# Patient Record
Sex: Male | Born: 1935 | Race: Black or African American | Hispanic: No | Marital: Married | Smoking: Former smoker
Health system: Southern US, Community
[De-identification: ages and names within clinical notes are randomized; demographics above are authoritative.]

## PROBLEM LIST (undated history)

## (undated) DIAGNOSIS — N4289 Other specified disorders of prostate: Secondary | ICD-10-CM

## (undated) HISTORY — PX: HIP SURGERY: SHX245

---

## 2017-11-14 DIAGNOSIS — H903 Sensorineural hearing loss, bilateral: Secondary | ICD-10-CM | POA: Insufficient documentation

## 2018-08-06 ENCOUNTER — Emergency Department (HOSPITAL_COMMUNITY): Payer: Medicare (Managed Care)

## 2018-08-06 ENCOUNTER — Emergency Department (HOSPITAL_COMMUNITY)
Admission: EM | Admit: 2018-08-06 | Discharge: 2018-08-06 | Disposition: A | Discharge status: Home / Self Care | Payer: Medicare (Managed Care) | Attending: Emergency Medicine | Admitting: Emergency Medicine

## 2018-08-06 ENCOUNTER — Other Ambulatory Visit: Payer: Self-pay

## 2018-08-06 ENCOUNTER — Encounter (HOSPITAL_COMMUNITY): Payer: Self-pay | Admitting: Emergency Medicine

## 2018-08-06 DIAGNOSIS — Y92009 Unspecified place in unspecified non-institutional (private) residence as the place of occurrence of the external cause: Secondary | ICD-10-CM | POA: Insufficient documentation

## 2018-08-06 DIAGNOSIS — W010XXA Fall on same level from slipping, tripping and stumbling without subsequent striking against object, initial encounter: Secondary | ICD-10-CM | POA: Diagnosis not present

## 2018-08-06 DIAGNOSIS — Y939 Activity, unspecified: Secondary | ICD-10-CM | POA: Diagnosis not present

## 2018-08-06 DIAGNOSIS — S32000A Wedge compression fracture of unspecified lumbar vertebra, initial encounter for closed fracture: Secondary | ICD-10-CM | POA: Diagnosis not present

## 2018-08-06 DIAGNOSIS — W19XXXA Unspecified fall, initial encounter: Secondary | ICD-10-CM

## 2018-08-06 DIAGNOSIS — S32592A Other specified fracture of left pubis, initial encounter for closed fracture: Secondary | ICD-10-CM | POA: Diagnosis not present

## 2018-08-06 DIAGNOSIS — Z79899 Other long term (current) drug therapy: Secondary | ICD-10-CM | POA: Insufficient documentation

## 2018-08-06 DIAGNOSIS — S3992XA Unspecified injury of lower back, initial encounter: Secondary | ICD-10-CM | POA: Diagnosis present

## 2018-08-06 DIAGNOSIS — Z87891 Personal history of nicotine dependence: Secondary | ICD-10-CM | POA: Diagnosis not present

## 2018-08-06 DIAGNOSIS — Y999 Unspecified external cause status: Secondary | ICD-10-CM | POA: Diagnosis not present

## 2018-08-06 HISTORY — DX: Other specified disorders of prostate: N42.89

## 2018-08-06 MED ORDER — OXYCODONE HCL 5 MG PO TABS: 5.0000 mg | ORAL_TABLET | ORAL | 0 refills | Status: AC | PRN

## 2018-08-06 MED ORDER — HYDROCODONE-ACETAMINOPHEN 5-325 MG PO TABS
1.0000 | ORAL_TABLET | Freq: Once | ORAL | Status: AC
  Administered 2018-08-06: 1 via ORAL
  Filled 2018-08-06: qty 1

## 2018-08-06 MED ORDER — OXYCODONE HCL 5 MG PO TABS
5.0000 mg | ORAL_TABLET | Freq: Once | ORAL | Status: AC
  Administered 2018-08-06: 5 mg via ORAL
  Filled 2018-08-06: qty 1

## 2018-08-06 NOTE — ED Provider Notes (Signed)
[facility] DEPT Provider Note   CSN: [id number] Arrival date & time: 08/06/18  0630     History   Chief Complaint Chief Complaint  Patient presents with  . Fall    HPI Andres Cruz is a 84 y.o. male.  HPI    84 year old male presents with concern for fall yesterday with difficulty walking today, back and leg pain.  Patient reports a mechanical fall yesterday, where he fell backwards onto his buttock, but reports he initially had felt okay.  He reports he went to pace yesterday because he had cold-like symptoms, was able to walk in and out without any symptoms.  He reports he is walking with his walker with severe pain.  Reports difficulty moving bilateral legs, however worse on the left.  Denies new numbness or weakness.  Reports that left leg movement is limited by pain.  No other concern for symptoms, including chest pain, shortness of breath, fevers, cough, denies syncope.  Reports that he did not hit his head yesterday, has no headache, no nausea or vomiting, and is not on anticoagulation. Past Medical History:  Diagnosis Date  . Other specified disorders of prostate     There are no active problems to display for this patient.   Past Surgical History:  Procedure Laterality Date  . HIP SURGERY          Home Medications    Prior to Admission medications   Medication Sig Start Date End Date Taking? Authorizing Provider  atorvastatin (LIPITOR) 10 MG tablet Take 10 mg by mouth daily.   Yes [provider]  busPIRone (BUSPAR) 10 MG tablet Take 20 mg by mouth at bedtime.    Yes [provider]  busPIRone (BUSPAR) 5 MG tablet Take 10 mg by mouth daily.   Yes [provider]  famotidine (PEPCID) 10 MG tablet Take 10 mg by mouth 2 (two) times daily.   Yes [provider]  gabapentin (NEURONTIN) 600 MG tablet Take 600 mg by mouth 2 (two) times daily.  08/12/17  Yes [provider]    hydrochlorothiazide (HYDRODIURIL) 25 MG tablet Take 25 mg by mouth daily.   Yes [provider]  HYDROcodone-acetaminophen (NORCO/VICODIN) 5-325 MG tablet Take 1 tablet by mouth every 6 (six) hours as needed for moderate pain.  08/30/17  Yes [provider]  Hypromellose (ARTIFICIAL TEARS OP) Apply 1 drop to eye daily as needed.   Yes [provider]  meloxicam (MOBIC) 15 MG tablet Take 15 mg by mouth daily as needed for pain.   Yes [provider]  Nutritional Supplements (ENSURE PO) Take 1 Can by mouth daily.   Yes [provider]  omeprazole (PRILOSEC) 20 MG capsule Take 20 mg by mouth daily.   Yes [provider]  Pancrelipase, Lip-Prot-Amyl, 24000-76000 units CPEP Take 24,000 Units by mouth 3 (three) times daily.    Yes [provider]  senna-docusate (SENOKOT-S) 8.6-50 MG tablet Take 1 tablet by mouth daily.   Yes [provider]  tamsulosin (FLOMAX) 0.4 MG CAPS capsule Take 0.4 mg by mouth daily.   Yes [provider]  triamcinolone cream (KENALOG) 0.1 % Apply 1 application topically 2 (two) times daily as needed for rash.   Yes [provider]  VITAMIN D, CHOLECALCIFEROL, PO Take 2,000 Units by mouth daily.   Yes [provider]  oxyCODONE (ROXICODONE) 5 MG immediate release tablet Take 1 tablet (5 mg total) by mouth every 4 (four) hours  as needed for severe pain. 08/06/18   Gareth Morgan, MD    Family History History reviewed. No pertinent family history.  Social History Social History   Tobacco Use  . Smoking status: Former Smoker    Types: Cigarettes    Last attempt to quit: 08/06/1998    Years since quitting: 20.0  . Smokeless tobacco: Never Used  Substance Use Topics  . Alcohol use: Not Currently  . Drug use: Never     Allergies   Patient has no known allergies.   Review of Systems Review of Systems  Constitutional: Negative for fever.  HENT: Positive for  congestion and postnasal drip. Negative for sore throat.   Eyes: Negative for visual disturbance.  Respiratory: Negative for shortness of breath.   Cardiovascular: Negative for chest pain.  Gastrointestinal: Negative for abdominal pain, nausea and vomiting.  Genitourinary: Negative for difficulty urinating.  Musculoskeletal: Positive for arthralgias and back pain. Negative for neck stiffness.  Skin: Negative for rash.  Neurological: Negative for syncope and headaches.     Physical Exam Updated Vital Signs BP 127/60   Pulse 79   Temp 98.4 F (36.9 C) (Oral)   Resp 20   Ht 6' (1.829 m)   Wt 81.2 kg   SpO2 97%   BMI 24.28 kg/m   Physical Exam Vitals signs and nursing note reviewed.  Constitutional:      General: He is not in acute distress.    Appearance: He is well-developed. He is not diaphoretic.  HENT:     Head: Normocephalic and atraumatic.  Eyes:     Conjunctiva/sclera: Conjunctivae normal.  Neck:     Musculoskeletal: Normal range of motion.  Cardiovascular:     Rate and Rhythm: Normal rate and regular rhythm.     Heart sounds: Normal heart sounds. No murmur. No friction rub. No gallop.   Pulmonary:     Effort: Pulmonary effort is normal. No respiratory distress.     Breath sounds: Normal breath sounds. No wheezing or rales.  Abdominal:     General: There is no distension.     Palpations: Abdomen is soft.     Tenderness: There is no abdominal tenderness. There is no guarding.  Musculoskeletal:     Right hip: He exhibits tenderness and bony tenderness.     Left hip: He exhibits tenderness and bony tenderness.     Cervical back: He exhibits no tenderness and no bony tenderness.     Thoracic back: He exhibits tenderness and bony tenderness (low thoracic).     Lumbar back: He exhibits tenderness and bony tenderness.  Skin:    General: Skin is warm and dry.  Neurological:     Mental Status: He is alert and oriented to person, place, and time.      ED  Treatments / Results  Labs (all labs ordered are listed, but only abnormal results are displayed) Labs Reviewed - No data to display  EKG None  Radiology Dg Thoracic Spine 2 View  Result Date: 08/06/2018 CLINICAL DATA:  The patient suffered a trip and fall yesterday. Thoracic spine pain. Initial encounter. EXAM: THORACIC SPINE 2 VIEWS COMPARISON:  None. FINDINGS: The patient has compression fractures of the T7 to L1 vertebral bodies. The patient is status post T11 vertebral augmentation. The fractures appear remote but are age indeterminate. There is exaggeration of the normal thoracic kyphosis. IMPRESSION: T7-L1 compression fractures appear remote but can not be definitively characterized. The patient is status post T11 vertebral augmentation. Electronically  Signed   By: Inge Rise M.D.   On: 08/06/2018 11:51   Dg Lumbar Spine Complete  Result Date: 08/06/2018 CLINICAL DATA:  Tripped and fell yesterday.  Back pain. EXAM: LUMBAR SPINE - COMPLETE 4+ VIEW COMPARISON:  None. FINDINGS: Degenerative anterolisthesis of L4 due to advanced facet disease. No definite pars defects. The other vertebral bodies are normally aligned. There are lower thoracic compression deformities with vertebral augmentation changes noted in T11. Compression fractures of T9, T10, V49 and L1 of uncertain age. The visualized bony pelvis is intact. A right hip prosthesis is noted. Advanced atherosclerotic calcifications involving the aorta and iliac arteries but no obvious aneurysm. IMPRESSION: Compression deformities of T9 through L1 of uncertain age. The T11 fracture contains bone cement from a prior vertebral augmentation procedure. Degenerative anterolisthesis of L4. Electronically Signed   By: Marijo Sanes M.D.   On: 08/06/2018 11:52   Dg Hip Unilat W Or Wo Pelvis 2-3 Views Left  Result Date: 08/06/2018 CLINICAL DATA:  [place]. Pain. EXAM: DG HIP (WITH OR WITHOUT PELVIS) 2-3V LEFT COMPARISON:  None FINDINGS: Status  post RIGHT total hip arthroplasty. No LEFT hip fracture or dislocation. LEFT superior pubic ramus fracture, mildly displaced. There is irregularity of the LEFT inferior pubic ramus, incompletely evaluated. Vascular calcification. No other visible pelvic fractures. IMPRESSION: LEFT superior pubic ramus fracture, mildly displaced. Difficult to exclude LEFT inferior pubic ramus injury. No visible LEFT hip fracture or dislocation. Postsurgical change RIGHT hip, appears uncomplicated. Electronically Signed   By: Staci Righter M.D.   On: 08/06/2018 11:54    Procedures Procedures (including critical care time)  Medications Ordered in ED Medications  HYDROcodone-acetaminophen (NORCO/VICODIN) 5-325 MG per tablet 1 tablet (1 tablet Oral Given 08/06/18 1039)  oxyCODONE (Oxy IR/ROXICODONE) immediate release tablet 5 mg (5 mg Oral Given 08/06/18 1627)     Initial Impression / Assessment and Plan / ED Course  I have reviewed the triage vital signs and the nursing notes.  Pertinent labs & imaging results that were available during my care of the patient were reviewed by me and considered in my medical decision making (see chart for details).     84 year old male presents with concern for fall yesterday with difficulty walking today, back and leg pain.   X-ray of the left hip and pelvis shows left superior pubic rami fracture, difficult to exclude left inferior pubic rami fractures.  Lumbar and thoracic spine x-rays show multiple compression fractures of unknown chronicity.  Patient does report tenderness of the lumbar spine, and assumes some component of acute fracture.  He does not have any other new neurologic changes, with left leg strength limited by pain from pubic rami fractures.  No other acute symptoms.  Call Dr. Drue Novel, agree with making patient weightbearing as tolerated, and have him follow-up in the office in 2 weeks for pubic rami fractures.  Discussed plan with Dr. Venetia Constable of neurosurgery  for his compression fractures, with plan for TLSO and outpatient neurosurgical follow-up.  Discussed with patient.  He does take chronic hydrocodone acetaminophen was reviewed in the [facility] drug database.  Given fractures with acute on chronic pain, will give a short prescription for oxycodone.  Recommend follow-up with PCP as well as orthopedics and neurosurgery.  Final Clinical Impressions(s) / ED Diagnoses   Final diagnoses:  Fall, initial encounter  Lumbar compression fracture, closed, initial encounter ([place])  Closed fracture of multiple pubic rami, left, initial encounter [facility])    ED Discharge Orders  Ordered    oxyCODONE (ROXICODONE) 5 MG immediate release tablet  Every 4 hours PRN     08/06/18 1704           Gareth Morgan, MD 08/06/18 2595

## 2018-08-06 NOTE — ED Triage Notes (Signed)
Patient arrived by EMS from home. Pt fell yesterday morning and tripped over carpet in bedroom. Pt denies LOC or hitting head. Pt fell onto back yesterday. Pt c/o RT lumbar, RT hip pain , and bilateral leg pain w/ decreased mobility in legs. Pt isn't on blood thinners. Alert and Oriented x 4.   Pt normally ambulates with walker. Pt has had previous hip fracture.   BP 110/60, HR 65, RR 18. EMS stated pain 4/10.   At home medications atrovostatin, buspirone, creon, gabapentin, HCTZ, hydrocodone, flomax, and vitamin D.

## 2018-08-06 NOTE — ED Notes (Signed)
[facility] has transported patient home.

## 2018-08-06 NOTE — ED Notes (Signed)
Bed: BT66 Expected date:  Expected time:  Means of arrival:  Comments: EMS 84 yo male fall with hip pain.

## 2018-08-06 NOTE — ED Notes (Signed)
[facility] called for updates on patient's plan of care. Patient gave me verbal consent to share this information stating that they provide them with insurance and primary care. [facility] phone number: [phone].

## 2018-08-06 NOTE — Discharge Instructions (Addendum)
Please use your walker and bear weight as tolerated for your pelvis fractures.  I recommend you take your regular medication for pain, but if you have breakthrough pain, you may take the oxycodone prescribed.  Take oxycodone as needed for breakthrough pain.  This medication can be addicting, sedating and cause constipation.

## 2018-08-06 NOTE — ED Notes (Signed)
Patient transported to X-ray 

## 2018-08-20 ENCOUNTER — Ambulatory Visit (INDEPENDENT_AMBULATORY_CARE_PROVIDER_SITE_OTHER): Payer: Self-pay | Admitting: Orthopaedic Surgery

## 2018-09-06 ENCOUNTER — Emergency Department (HOSPITAL_COMMUNITY): Payer: Medicare (Managed Care)

## 2018-09-06 ENCOUNTER — Inpatient Hospital Stay (HOSPITAL_COMMUNITY)
Admission: RE | Admit: 2018-09-06 | Discharge: 2018-10-06 | DRG: 207 | Disposition: E | Payer: Medicare (Managed Care) | Attending: Emergency Medicine | Admitting: Emergency Medicine

## 2018-09-06 ENCOUNTER — Encounter (HOSPITAL_COMMUNITY): Payer: Self-pay | Admitting: Emergency Medicine

## 2018-09-06 DIAGNOSIS — K219 Gastro-esophageal reflux disease without esophagitis: Secondary | ICD-10-CM | POA: Diagnosis present

## 2018-09-06 DIAGNOSIS — I10 Essential (primary) hypertension: Secondary | ICD-10-CM | POA: Diagnosis present

## 2018-09-06 DIAGNOSIS — Z66 Do not resuscitate: Secondary | ICD-10-CM | POA: Diagnosis present

## 2018-09-06 DIAGNOSIS — L8990 Pressure ulcer of unspecified site, unspecified stage: Secondary | ICD-10-CM

## 2018-09-06 DIAGNOSIS — N400 Enlarged prostate without lower urinary tract symptoms: Secondary | ICD-10-CM | POA: Diagnosis present

## 2018-09-06 DIAGNOSIS — Z4659 Encounter for fitting and adjustment of other gastrointestinal appliance and device: Secondary | ICD-10-CM

## 2018-09-06 DIAGNOSIS — E440 Moderate protein-calorie malnutrition: Secondary | ICD-10-CM | POA: Diagnosis present

## 2018-09-06 DIAGNOSIS — H903 Sensorineural hearing loss, bilateral: Secondary | ICD-10-CM | POA: Diagnosis present

## 2018-09-06 DIAGNOSIS — Z79891 Long term (current) use of opiate analgesic: Secondary | ICD-10-CM

## 2018-09-06 DIAGNOSIS — Z9181 History of falling: Secondary | ICD-10-CM

## 2018-09-06 DIAGNOSIS — R0602 Shortness of breath: Secondary | ICD-10-CM

## 2018-09-06 DIAGNOSIS — R634 Abnormal weight loss: Secondary | ICD-10-CM | POA: Diagnosis present

## 2018-09-06 DIAGNOSIS — R6521 Severe sepsis with septic shock: Secondary | ICD-10-CM | POA: Clinically undetermined

## 2018-09-06 DIAGNOSIS — Z87891 Personal history of nicotine dependence: Secondary | ICD-10-CM

## 2018-09-06 DIAGNOSIS — Z96641 Presence of right artificial hip joint: Secondary | ICD-10-CM | POA: Diagnosis present

## 2018-09-06 DIAGNOSIS — K3189 Other diseases of stomach and duodenum: Secondary | ICD-10-CM

## 2018-09-06 DIAGNOSIS — K8689 Other specified diseases of pancreas: Secondary | ICD-10-CM | POA: Diagnosis present

## 2018-09-06 DIAGNOSIS — Z9289 Personal history of other medical treatment: Secondary | ICD-10-CM

## 2018-09-06 DIAGNOSIS — T07XXXA Unspecified multiple injuries, initial encounter: Secondary | ICD-10-CM

## 2018-09-06 DIAGNOSIS — B3781 Candidal esophagitis: Secondary | ICD-10-CM | POA: Diagnosis present

## 2018-09-06 DIAGNOSIS — E785 Hyperlipidemia, unspecified: Secondary | ICD-10-CM | POA: Diagnosis present

## 2018-09-06 DIAGNOSIS — J9600 Acute respiratory failure, unspecified whether with hypoxia or hypercapnia: Principal | ICD-10-CM | POA: Diagnosis present

## 2018-09-06 DIAGNOSIS — R109 Unspecified abdominal pain: Secondary | ICD-10-CM

## 2018-09-06 DIAGNOSIS — J690 Pneumonitis due to inhalation of food and vomit: Secondary | ICD-10-CM | POA: Diagnosis present

## 2018-09-06 DIAGNOSIS — J189 Pneumonia, unspecified organism: Secondary | ICD-10-CM

## 2018-09-06 DIAGNOSIS — W19XXXD Unspecified fall, subsequent encounter: Secondary | ICD-10-CM | POA: Diagnosis present

## 2018-09-06 DIAGNOSIS — S32592D Other specified fracture of left pubis, subsequent encounter for fracture with routine healing: Secondary | ICD-10-CM

## 2018-09-06 DIAGNOSIS — K311 Adult hypertrophic pyloric stenosis: Secondary | ICD-10-CM | POA: Diagnosis present

## 2018-09-06 DIAGNOSIS — K2970 Gastritis, unspecified, without bleeding: Secondary | ICD-10-CM | POA: Diagnosis present

## 2018-09-06 DIAGNOSIS — L89150 Pressure ulcer of sacral region, unstageable: Secondary | ICD-10-CM | POA: Diagnosis present

## 2018-09-06 DIAGNOSIS — C170 Malignant neoplasm of duodenum: Secondary | ICD-10-CM | POA: Diagnosis present

## 2018-09-06 DIAGNOSIS — Z515 Encounter for palliative care: Secondary | ICD-10-CM | POA: Diagnosis not present

## 2018-09-06 DIAGNOSIS — R112 Nausea with vomiting, unspecified: Secondary | ICD-10-CM

## 2018-09-06 DIAGNOSIS — A419 Sepsis, unspecified organism: Secondary | ICD-10-CM | POA: Clinically undetermined

## 2018-09-06 DIAGNOSIS — E876 Hypokalemia: Secondary | ICD-10-CM | POA: Diagnosis present

## 2018-09-06 LAB — CBC WITH DIFFERENTIAL/PLATELET
ABS IMMATURE GRANULOCYTES: 0.14 10*3/uL — AB (ref 0.00–0.07)
Basophils Absolute: 0 10*3/uL (ref 0.0–0.1)
Basophils Relative: 0 %
Eosinophils Absolute: 0 10*3/uL (ref 0.0–0.5)
Eosinophils Relative: 0 %
HCT: 42 % (ref 39.0–52.0)
Hemoglobin: 13.3 g/dL (ref 13.0–17.0)
Immature Granulocytes: 1 %
Lymphocytes Relative: 13 %
Lymphs Abs: 2.1 10*3/uL (ref 0.7–4.0)
MCH: 29.5 pg (ref 26.0–34.0)
MCHC: 31.7 g/dL (ref 30.0–36.0)
MCV: 93.1 fL (ref 80.0–100.0)
Monocytes Absolute: 0.6 10*3/uL (ref 0.1–1.0)
Monocytes Relative: 4 %
Neutro Abs: 12.9 10*3/uL — ABNORMAL HIGH (ref 1.7–7.7)
Neutrophils Relative %: 82 %
Platelets: 272 10*3/uL (ref 150–400)
RBC: 4.51 MIL/uL (ref 4.22–5.81)
RDW: 14.1 % (ref 11.5–15.5)
WBC: 15.8 10*3/uL — ABNORMAL HIGH (ref 4.0–10.5)
nRBC: 0 % (ref 0.0–0.2)

## 2018-09-06 LAB — COMPREHENSIVE METABOLIC PANEL
ALK PHOS: 186 U/L — AB (ref 38–126)
ALT: 12 U/L (ref 0–44)
ANION GAP: 15 (ref 5–15)
AST: 29 U/L (ref 15–41)
Albumin: 4.4 g/dL (ref 3.5–5.0)
BILIRUBIN TOTAL: 1.5 mg/dL — AB (ref 0.3–1.2)
BUN: 24 mg/dL — ABNORMAL HIGH (ref 8–23)
CO2: 27 mmol/L (ref 22–32)
Calcium: 9.5 mg/dL (ref 8.9–10.3)
Chloride: 96 mmol/L — ABNORMAL LOW (ref 98–111)
Creatinine, Ser: 1.16 mg/dL (ref 0.61–1.24)
GFR calc Af Amer: >60 mL/min (ref >60)
GFR calc non Af Amer: 56 mL/min — ABNORMAL LOW (ref >60)
Glucose, Bld: 187 mg/dL — ABNORMAL HIGH (ref 70–99)
Potassium: 3.7 mmol/L (ref 3.5–5.1)
Sodium: 138 mmol/L (ref 135–145)
TOTAL PROTEIN: 7.8 g/dL (ref 6.5–8.1)

## 2018-09-06 LAB — PROTIME-INR
INR: 0.96
Prothrombin Time: 12.6 seconds (ref 11.4–15.2)

## 2018-09-06 LAB — LACTIC ACID, PLASMA: Lactic Acid, Venous: 2.9 mmol/L (ref 0.5–1.9)

## 2018-09-06 MED ORDER — ONDANSETRON HCL 4 MG/2ML IJ SOLN
4.0000 mg | Freq: Once | INTRAMUSCULAR | Status: AC
  Administered 2018-09-06: 4 mg via INTRAVENOUS
  Filled 2018-09-06: qty 2

## 2018-09-06 MED ORDER — SODIUM CHLORIDE 0.9 % IV BOLUS
1000.0000 mL | Freq: Once | INTRAVENOUS | Status: AC
  Administered 2018-09-06: 1000 mL via INTRAVENOUS

## 2018-09-06 MED ORDER — SODIUM CHLORIDE 0.9% FLUSH
3.0000 mL | Freq: Once | INTRAVENOUS | Status: AC
  Administered 2018-09-07: 3 mL via INTRAVENOUS

## 2018-09-06 MED ORDER — ALBUTEROL SULFATE (2.5 MG/3ML) 0.083% IN NEBU
5.0000 mg | INHALATION_SOLUTION | Freq: Once | RESPIRATORY_TRACT | Status: AC
  Administered 2018-09-07: 5 mg via RESPIRATORY_TRACT
  Filled 2018-09-06: qty 6

## 2018-09-06 NOTE — ED Notes (Signed)
Bed: EY81 Expected date:  Expected time:  Means of arrival:  Comments: EMS 85 yo male from home-sick x 2 days/N/V/D, SOB-HR 104 neb tx-exp wheezing RUL-possible sepsis O2 sat 80's-CBG 226

## 2018-09-06 NOTE — ED Notes (Signed)
Date and time results received: 08/10/2018 2343  Test: Lactic Critical Value: 2.9  Name of Provider Notified:Rob EDPA  Orders Received? Or Actions Taken?:

## 2018-09-06 NOTE — ED Provider Notes (Addendum)
[facility] DEPT Provider Note   CSN: [id number] Arrival date & time: 08/22/2018  2219     History   Chief Complaint Chief Complaint  Patient presents with  . N/V/D  . Wheezing  . Shortness of Breath    HPI Andres Cruz is a 85 y.o. male with medical history of hypertension, recent fall sustaining pubic fractures presenting to the emergency department with multiple medical complaints.  Pt reports abdominal pain x 2 months. He first noticed pain after his fall in December 2019. The pain has worsened over the past 2 days. He describes it as cramping located at the umbilicus. The pain does not radiate. He does not have history of similar pain. Spouse reports associated multiple episodes of vomiting and diarrhea. He has been unable to tolerate PO intake. He did take 2 hydrocodone prior to EMS arrival. He reports minimal pain relief.  Spouse reports weight loss of approximately 15 pounds since his fall. Denies bloody emesis, diarrhea, constipation.  Patient's spouse reports that he has had URI symptoms x1 week, but over the last 2 days his symptoms have progressively worsened. He has productive cough with white phlegm. He has been taking cold medicine without relief. Spouse reports he has had increasing shortness of breath. He denies fever, chest pain, palpitations. Admits chills.  On EMS arrival they reported tachypnea around 28 breath per minute and wheezing in upper lung fields.  He was given Solu-Medrol.  Blood sugar was 226.  Denies history of diabetes.   History provided by pt and spouse.  Past Medical History:  Diagnosis Date  . Other specified disorders of prostate     Patient Active Problem List   Diagnosis Date Noted  . Acute respiratory failure ([place]) 09/07/2018  . Gastric outlet obstruction 09/07/2018  . Multiple fractures 09/07/2018  . Hypokalemia 09/07/2018  . Sensorineural hearing loss (SNHL) of both ears 11/14/2017    Past Surgical  History:  Procedure Laterality Date  . HIP SURGERY          Home Medications    Prior to Admission medications   Medication Sig Start Date End Date Taking? Authorizing Provider  atorvastatin (LIPITOR) 10 MG tablet Take 10 mg by mouth daily.   Yes [provider]  busPIRone (BUSPAR) 10 MG tablet Take 20 mg by mouth at bedtime.    Yes [provider]  busPIRone (BUSPAR) 5 MG tablet Take 10 mg by mouth daily.   Yes [provider]  calcium carbonate (OSCAL) 1500 (600 Ca) MG TABS tablet Take 1,200 mg of elemental calcium by mouth 2 (two) times daily with a meal.   Yes [provider]  Ergocalciferol (VITAMIN D2) 50 MCG (2000 UT) TABS Take 1 capsule by mouth daily.   Yes [provider]  famotidine (PEPCID) 10 MG tablet Take 10 mg by mouth 2 (two) times daily.   Yes [provider]  gabapentin (NEURONTIN) 600 MG tablet Take 600 mg by mouth 2 (two) times daily.  08/12/17  Yes [provider]  hydrochlorothiazide (HYDRODIURIL) 25 MG tablet Take 25 mg by mouth daily.   Yes [provider]  HYDROcodone-acetaminophen (NORCO/VICODIN) 5-325 MG tablet Take 1 tablet by mouth every 6 (six) hours as needed for moderate pain.  08/30/17  Yes [provider]  Pancrelipase, Lip-Prot-Amyl, 24000-76000 units CPEP Take 24,000 Units by mouth 3 (three) times daily.    Yes [provider]  senna-docusate (SENOKOT-S) 8.6-50 MG tablet Take 1 tablet by mouth daily.  Yes [provider]  tamsulosin (FLOMAX) 0.4 MG CAPS capsule Take 0.4 mg by mouth daily.   Yes [provider]  triamcinolone cream (KENALOG) 0.1 % Apply 1 application topically 2 (two) times daily as needed for rash.   Yes [provider]  oxyCODONE (ROXICODONE) 5 MG immediate release tablet Take 1 tablet (5 mg total) by mouth every 4 (four) hours as needed for severe pain. Patient not taking: Reported on 09/07/2018 08/06/18   Gareth Morgan,  MD    Family History No family history on file.  Social History Social History   Tobacco Use  . Smoking status: Former Smoker    Types: Cigarettes    Last attempt to quit: 08/06/1998    Years since quitting: 20.1  . Smokeless tobacco: Never Used  Substance Use Topics  . Alcohol use: Not Currently  . Drug use: Never     Allergies   Patient has no known allergies.   Review of Systems Review of Systems  Constitutional: Positive for chills and unexpected weight change. Negative for fever.  HENT: Positive for congestion. Negative for sinus pressure and sore throat.   Eyes: Negative for pain and visual disturbance.  Respiratory: Positive for cough and wheezing. Negative for chest tightness and shortness of breath.   Cardiovascular: Negative for chest pain and palpitations.  Gastrointestinal: Positive for abdominal pain, diarrhea, nausea and vomiting.  Genitourinary: Negative for difficulty urinating and hematuria.  Musculoskeletal: Negative for arthralgias, back pain and neck pain.  Skin: Negative for rash and wound.  Neurological: Negative for syncope and headaches.     Physical Exam Updated Vital Signs BP 121/77   Pulse (!) 109   Temp 98.8 F (37.1 C)   Resp 16   Ht 6' (1.829 m)   Wt 81.2 kg   SpO2 96%   BMI 24.28 kg/m   Physical Exam Vitals signs and nursing note reviewed.  Constitutional:      Appearance: He is not ill-appearing or toxic-appearing.     Comments: Pt is alert. Pt is frail. He is able to speak in full sentences. Increased work of breathing using accessory muscles. Mild respiratory distress  HENT:     Head: Normocephalic and atraumatic.     Right Ear: Tympanic membrane normal.     Left Ear: Tympanic membrane normal.     Nose: Nose normal.     Mouth/Throat:     Mouth: Mucous membranes are dry.     Pharynx: Oropharynx is clear.  Eyes:     General: No scleral icterus.    Conjunctiva/sclera: Conjunctivae normal.  Neck:     Musculoskeletal:  Normal range of motion. No neck rigidity or muscular tenderness.  Cardiovascular:     Rate and Rhythm: Regular rhythm. Tachycardia present.     Pulses: Normal pulses.     Heart sounds: Normal heart sounds.  Pulmonary:     Effort: Tachypnea present. No accessory muscle usage.  Abdominal:     General: There is no distension.     Palpations: Abdomen is soft.     Tenderness: There is no abdominal tenderness. There is no guarding or rebound.  Musculoskeletal: Normal range of motion.  Skin:    General: Skin is warm and dry.     Capillary Refill: Capillary refill takes less than 2 seconds.     Findings: No lesion or rash.  Neurological:     Mental Status: Mental status is at baseline. He is lethargic.     Motor: No weakness.  Psychiatric:        Behavior: Behavior normal.      ED Treatments / Results  Labs (all labs ordered are listed, but only abnormal results are displayed) Labs Reviewed  COMPREHENSIVE METABOLIC PANEL - Abnormal; Notable for the following components:      Result Value   Chloride 96 (*)    Glucose, Bld 187 (*)    BUN 24 (*)    Alkaline Phosphatase 186 (*)    Total Bilirubin 1.5 (*)    GFR calc non Af Amer 56 (*)    All other components within normal limits  LACTIC ACID, PLASMA - Abnormal; Notable for the following components:   Lactic Acid, Venous 2.9 (*)    All other components within normal limits  CBC WITH DIFFERENTIAL/PLATELET - Abnormal; Notable for the following components:   WBC 15.8 (*)    Neutro Abs 12.9 (*)    Abs Immature Granulocytes 0.14 (*)    All other components within normal limits  URINALYSIS, ROUTINE W REFLEX MICROSCOPIC - Abnormal; Notable for the following components:   Color, Urine AMBER (*)    APPearance HAZY (*)    Ketones, ur 5 (*)    Protein, ur 30 (*)    Bacteria, UA RARE (*)    All other components within normal limits  BLOOD GAS, ARTERIAL - Abnormal; Notable for the following components:   pO2, Arterial 141 (*)     Acid-Base Excess 2.8 (*)    All other components within normal limits  LACTIC ACID, PLASMA - Abnormal; Notable for the following components:   Lactic Acid, Venous 3.2 (*)    All other components within normal limits  CBC - Abnormal; Notable for the following components:   WBC 12.1 (*)    All other components within normal limits  BASIC METABOLIC PANEL - Abnormal; Notable for the following components:   Potassium 2.9 (*)    Chloride 96 (*)    Glucose, Bld 166 (*)    BUN 26 (*)    GFR calc non Af Amer 58 (*)    All other components within normal limits  PREALBUMIN - Abnormal; Notable for the following components:   Prealbumin 17.7 (*)    All other components within normal limits  CULTURE, BLOOD (ROUTINE X 2)  CULTURE, BLOOD (ROUTINE X 2)  CULTURE, RESPIRATORY  PROTIME-INR  INFLUENZA PANEL BY PCR (TYPE A & B)  TRIGLYCERIDES  MAGNESIUM  PHOSPHORUS    EKG EKG Interpretation  Date/Time:  Friday September 06 2018 22:25:47 EST Ventricular Rate:  115 PR Interval:    QRS Duration: 150 QT Interval:  334 QTC Calculation: 462 R Axis:   -26 Text Interpretation:  Sinus tachycardia Multiform ventricular premature complexes Right bundle branch block Abnormal T, consider ischemia, lateral leads NO STEMI No old tracing to compare Confirmed by Addison Lank [phone]) on 08/12/2018 11:22:20 PM   Radiology Ct Abdomen Pelvis Wo Contrast  Result Date: 09/07/2018 CLINICAL DATA:  85 y/o M; chest and abdominal pain. Abdominal distention. Dyspnea. Nausea and vomiting. EXAM: CT CHEST, ABDOMEN AND PELVIS WITHOUT CONTRAST TECHNIQUE: Multidetector CT imaging of the chest, abdomen and pelvis was performed following the standard protocol without IV contrast. COMPARISON:  None. FINDINGS: CT CHEST FINDINGS Cardiovascular: Normal heart size. No pericardial effusion. Coronary artery calcific atherosclerosis. Thoracic aorta measures up to 4.3 cm. Mediastinum/Nodes: Marked distention of the esophagus with fluid level  likely due to downstream obstruction. Normal thyroid. No mediastinal or axillary adenopathy. Lungs/Pleura: Patchy areas of consolidation  within the left upper lobe lingula and right lower lobe with clustered nodules compatible with bronchopneumonia. Findings may represent aspiration. No pleural effusion or pneumothorax. Musculoskeletal: T8, T9, T11, T12, L1 compression deformities, age indeterminate. T11 augmentation. L4-5 grade 1 anterolisthesis. CT ABDOMEN PELVIS FINDINGS Hepatobiliary: Pneumobilia within the left lobe of the liver. Otherwise no focal liver abnormality is seen. No gallstones, gallbladder wall thickening, or biliary dilatation. Pancreas: Unremarkable. No pancreatic ductal dilatation or surrounding inflammatory changes. Spleen: Normal in size without focal abnormality. Adrenals/Urinary Tract: Normal adrenal glands. Multiple renal cysts bilaterally measuring up to 6.3 cm arising from the left kidney upper pole. No urinary stone disease or hydronephrosis. Bladder is unremarkable. Stomach/Bowel: Massive distention of the stomach and proximal duodenum to the level of the junction between second and third segments of the duodenum (series 4, image 94). Massive distention of the stomach elevates the left hemidiaphragm. No obstructive or inflammatory changes of the small and large bowel downstream to the duodenum. Sigmoid diverticulosis. Normal appendix. Vascular/Lymphatic: Aortic atherosclerosis. No enlarged abdominal or pelvic lymph nodes. Reproductive: Prostate is unremarkable. Other: Small left inguinal hernia containing fat. Musculoskeletal: Right total hip prosthesis without periprosthetic lucency or fracture identified, incompletely visualized. The acetabular screw traverses the medial cortex of the acetabulum extending into the iliopsoas muscle (series 4, image 104). Subacute fractures with callus formation involving the left superior and inferior pubic rami. Right-greater-than-left sacral  fractures. IMPRESSION: 1. Massive distention of the stomach and proximal duodenum. Findings may represent a stricture or obstructive mass of the duodenum. The distended stomach elevates the left hemidiaphragm and displaces rightward the mediastinum. 2. Distention of esophagus with fluid levels likely due to downstream duodenal obstruction. 3. Left upper lobe lingula and right lower lobe consolidation may reflect bronchopneumonia or possibly aspiration. 4. Thoracic aortic aneurysm measuring up to 4.3 cm. Recommend annual imaging followup by CTA or MRA. This recommendation follows 2010 ACCF/AHA/AATS/ACR/ASA/SCA/SCAI/SIR/STS/SVM Guidelines for the Diagnosis and Management of Patients with Thoracic Aortic Disease. 2010; 121: W098-J191. 5. Coronary artery and aortic calcific 6. Subacute fractures of left superior and inferior pubic rami as well as right-greater-than-left sacrum. 7. T8, T9, T11, T12, L1 compression deformities, age indeterminate. Electronically Signed   By: Kristine Garbe M.D.   On: 09/07/2018 00:39   Dg Chest 2 View  Result Date: 08/16/2018 CLINICAL DATA:  Shortness of breath EXAM: CHEST - 2 VIEW COMPARISON:  None. FINDINGS: Marked elevation of the left hemidiaphragm and gaseous distention of the stomach beneath the left hemidiaphragm. Compressive atelectasis in the left lower lobe. Low volumes on the right without confluent airspace opacity. Heart appears to be normal size. No visible effusions or pneumothorax. No acute bony abnormality. IMPRESSION: Marked gaseous distention of the stomach and elevation of the left hemidiaphragm. Compressive atelectasis in the left lower lobe. Very low lung volumes. Electronically Signed   By: Rolm Baptise M.D.   On: 08/24/2018 23:11   Dg Abdomen 1 View  Result Date: 09/07/2018 CLINICAL DATA:  Orogastric tube placement EXAM: ABDOMEN - 1 VIEW COMPARISON:  CT 08/31/2018 FINDINGS: The tip and side port of a gastric tube are noted in the left upper quadrant  in the expected location of the distended stomach. Stool is seen within nonobstructed, nondistended large bowel. Atelectasis is seen at the lung bases. Partially included right hip arthroplasty. IMPRESSION: Gastric tube with tip and side-port are seen in the expected location of the stomach. Electronically Signed   By: Ashley Royalty M.D.   On: 09/07/2018 02:20   Ct Chest  Wo Contrast  Result Date: 09/07/2018 CLINICAL DATA:  85 y/o M; chest and abdominal pain. Abdominal distention. Dyspnea. Nausea and vomiting. EXAM: CT CHEST, ABDOMEN AND PELVIS WITHOUT CONTRAST TECHNIQUE: Multidetector CT imaging of the chest, abdomen and pelvis was performed following the standard protocol without IV contrast. COMPARISON:  None. FINDINGS: CT CHEST FINDINGS Cardiovascular: Normal heart size. No pericardial effusion. Coronary artery calcific atherosclerosis. Thoracic aorta measures up to 4.3 cm. Mediastinum/Nodes: Marked distention of the esophagus with fluid level likely due to downstream obstruction. Normal thyroid. No mediastinal or axillary adenopathy. Lungs/Pleura: Patchy areas of consolidation within the left upper lobe lingula and right lower lobe with clustered nodules compatible with bronchopneumonia. Findings may represent aspiration. No pleural effusion or pneumothorax. Musculoskeletal: T8, T9, T11, T12, L1 compression deformities, age indeterminate. T11 augmentation. L4-5 grade 1 anterolisthesis. CT ABDOMEN PELVIS FINDINGS Hepatobiliary: Pneumobilia within the left lobe of the liver. Otherwise no focal liver abnormality is seen. No gallstones, gallbladder wall thickening, or biliary dilatation. Pancreas: Unremarkable. No pancreatic ductal dilatation or surrounding inflammatory changes. Spleen: Normal in size without focal abnormality. Adrenals/Urinary Tract: Normal adrenal glands. Multiple renal cysts bilaterally measuring up to 6.3 cm arising from the left kidney upper pole. No urinary stone disease or hydronephrosis.  Bladder is unremarkable. Stomach/Bowel: Massive distention of the stomach and proximal duodenum to the level of the junction between second and third segments of the duodenum (series 4, image 94). Massive distention of the stomach elevates the left hemidiaphragm. No obstructive or inflammatory changes of the small and large bowel downstream to the duodenum. Sigmoid diverticulosis. Normal appendix. Vascular/Lymphatic: Aortic atherosclerosis. No enlarged abdominal or pelvic lymph nodes. Reproductive: Prostate is unremarkable. Other: Small left inguinal hernia containing fat. Musculoskeletal: Right total hip prosthesis without periprosthetic lucency or fracture identified, incompletely visualized. The acetabular screw traverses the medial cortex of the acetabulum extending into the iliopsoas muscle (series 4, image 104). Subacute fractures with callus formation involving the left superior and inferior pubic rami. Right-greater-than-left sacral fractures. IMPRESSION: 1. Massive distention of the stomach and proximal duodenum. Findings may represent a stricture or obstructive mass of the duodenum. The distended stomach elevates the left hemidiaphragm and displaces rightward the mediastinum. 2. Distention of esophagus with fluid levels likely due to downstream duodenal obstruction. 3. Left upper lobe lingula and right lower lobe consolidation may reflect bronchopneumonia or possibly aspiration. 4. Thoracic aortic aneurysm measuring up to 4.3 cm. Recommend annual imaging followup by CTA or MRA. This recommendation follows 2010 ACCF/AHA/AATS/ACR/ASA/SCA/SCAI/SIR/STS/SVM Guidelines for the Diagnosis and Management of Patients with Thoracic Aortic Disease. 2010; 121: Z601-U932. 5. Coronary artery and aortic calcific 6. Subacute fractures of left superior and inferior pubic rami as well as right-greater-than-left sacrum. 7. T8, T9, T11, T12, L1 compression deformities, age indeterminate. Electronically Signed   By: Kristine Garbe M.D.   On: 09/07/2018 00:39   Dg Chest Port 1 View  Result Date: 09/07/2018 CLINICAL DATA:  Post intubation EXAM: PORTABLE CHEST 1 VIEW COMPARISON:  08/27/2018 FINDINGS: Endotracheal tube is been placed with tip measuring 5.4 cm above the carina. Enteric tube is been placed with tip over the left lower chest consistent with location in the stomach which on the previous study was demonstrated below and elevated left hemidiaphragm. The stomach has decompressed since the previous study. There is a shallow inspiration. Atelectasis or infiltration in both lung bases with probable small pleural effusions. Heart size is normal. No pneumothorax. Degenerative changes in the shoulders. IMPRESSION: Appliances appear in satisfactory location. Shallow inspiration with atelectasis  or infiltration in the lung bases and small bilateral pleural effusions. Electronically Signed   By: Lucienne Capers M.D.   On: 09/07/2018 02:16    Procedures Procedure Name: Intubation Date/Time: 09/07/2018 3:23 AM Performed by: Cherre Robins, PA-C Pre-anesthesia Checklist: Patient identified, Emergency Drugs available, Suction available, Patient being monitored and Timeout performed Oxygen Delivery Method: Ambu bag Preoxygenation: Pre-oxygenation with 100% oxygen Induction Type: IV induction Ventilation: Mask ventilation without difficulty Laryngoscope Size: Glidescope and 3 Tube size: 7.5 mm Number of attempts: 1 Airway Equipment and Method: Video-laryngoscopy Placement Confirmation: ETT inserted through vocal cords under direct vision,  Positive ETCO2,  CO2 detector and Breath sounds checked- equal and bilateral Secured at: 22 cm Tube secured with: ETT holder Dental Injury: Teeth and Oropharynx as per pre-operative assessment      .Critical Care Performed by: Cherre Robins, PA-C Authorized by: Cherre Robins, PA-C   Critical care provider statement:    Critical care time (minutes):   55   Critical care time was exclusive of:  Teaching time   Critical care was necessary to treat or prevent imminent or life-threatening deterioration of the following conditions:  Respiratory failure   Critical care was time spent personally by me on the following activities:  Discussions with consultants, evaluation of patient's response to treatment, examination of patient, ordering and performing treatments and interventions, ordering and review of laboratory studies, ordering and review of radiographic studies, pulse oximetry, re-evaluation of patient's condition, obtaining history from patient or surrogate, review of old charts, ventilator management and development of treatment plan with patient or surrogate   (including critical care time)  Medications Ordered in ED Medications  fentaNYL (SUBLIMAZE) injection 50 mcg (50 mcg Intravenous Given 09/07/18 0221)  fentaNYL (SUBLIMAZE) injection 50 mcg (has no administration in time range)  propofol (DIPRIVAN) 1000 MG/100ML infusion (35 mcg/kg/min  81.2 kg Intravenous New Bag/Given 09/07/18 0804)  famotidine (PEPCID) IVPB 20 mg premix (0 mg Intravenous Stopped 09/07/18 0658)  ondansetron (ZOFRAN) injection 4 mg (has no administration in time range)  lactated ringers infusion ( Intravenous New Bag/Given 09/07/18 0443)  cefTRIAXone (ROCEPHIN) 1 g in sodium chloride 0.9 % 100 mL IVPB (1 g Intravenous New Bag/Given 09/07/18 0759)  azithromycin (ZITHROMAX) 500 mg in sodium chloride 0.9 % 250 mL IVPB (500 mg Intravenous New Bag/Given 09/07/18 0558)  albuterol (PROVENTIL) (2.5 MG/3ML) 0.083% nebulizer solution 5 mg (5 mg Nebulization Given 09/07/18 0020)  sodium chloride flush (NS) 0.9 % injection 3 mL (3 mLs Intravenous Given 09/07/18 0800)  ondansetron (ZOFRAN) injection 4 mg (4 mg Intravenous Given 08/18/2018 2356)  sodium chloride 0.9 % bolus 1,000 mL (0 mLs Intravenous Stopped 09/07/18 0301)  lidocaine (XYLOCAINE) 2 % jelly 1 application (1 application Other Given  09/07/18 0217)     Initial Impression / Assessment and Plan / ED Course  I have reviewed the triage vital signs and the nursing notes.  Pertinent labs & imaging results that were available during my care of the patient were reviewed by me and considered in my medical decision making (see chart for details).   Pt is afebrile, frail appearing in mild respiratory distress. Chest xray ordered given pt's complaint of shortness pf breath, wheezing, and cough.  I viewed the chest xray and it shows left marked gaseous distention of the stomach and elevation of the left hemidiaphragm. CT chest and abdomen pelvis ordered to further evaluate. CT chest shows massive distention of the stomach and proximal duodenum concerning for obstruction. Also is  concerning for left upper lobe and right middle lobe consolidation consistent with pneumonia. On repeat exam pt continues to have increased work of breathing with SpO2 at 90% on room air with maximum effort. Pt is pending respiratory failure. Pt intubated without difficulty and tube placement confirmed with portable chest xray. After intubation OG tube inserted for gastric decompression by nursing staff without difficulty, placement confirmed with imaging. OG tube with low intermittent wall suction with brown drainage.   Pt's CBC with leukocytosis of 15.8. CMP with elevated alkaline phosphate of 186 and slight elevation of total bilirubin to 1.5. Lactic acid elevated to 2.9. Influenza negative. UA is not suggestive of UTI. EKG shows sinus tachycardia with multiform ventricular premature complexes and RBBB, no prior to compare.  On re-evaluation pt is sedated, appears to be comfortable on continuous propofol infusion with prn fentanyl to maintain RAAS -3.  Pt will be admitted to intensivist for management of gastric outlet obstruction, respiratory failure on vent, and pneumonia. Surgery will consult.  The patient was discussed with and seen by Dr. Leonette Monarch who agrees  with the treatment plan.   Portions of this note were generated with [employer]. Dictation errors may occur despite best attempts at proofreading.    Final Clinical Impressions(s) / ED Diagnoses   Final diagnoses:  Gastric outlet obstruction  Non-intractable vomiting with nausea, unspecified vomiting type  Abdominal pain, unspecified abdominal location  Shortness of breath    ED Discharge Orders    None       Cherre Robins, PA-C 09/07/18 [place], [place], PA-C 09/17/2018 [place], MD 09/28/2018 [phone]

## 2018-09-06 NOTE — ED Triage Notes (Signed)
Patient arrives by [facility] with complaints of N/V/D x 2 days-wet cough for 3-4 days-patient actively vomiting on arrival to ED. Patient wheezing RUL per EMS-tachypnea-RR 26-28-albuterol 5 mg neb administered with Solumedrol 125 mg per EMS BP 122 palp. CBG 226 with no hx Diabetes. Wife administered 2 hydrocodone prior to EMS arrival at 2000. MP ST with multifocal PVC's per EMS. ST with BBB.

## 2018-09-06 NOTE — ED Notes (Signed)
In xray

## 2018-09-07 ENCOUNTER — Emergency Department (HOSPITAL_COMMUNITY): Payer: Medicare (Managed Care)

## 2018-09-07 ENCOUNTER — Encounter (HOSPITAL_COMMUNITY): Payer: Self-pay | Admitting: *Deleted

## 2018-09-07 ENCOUNTER — Other Ambulatory Visit: Payer: Self-pay

## 2018-09-07 DIAGNOSIS — J9600 Acute respiratory failure, unspecified whether with hypoxia or hypercapnia: Secondary | ICD-10-CM | POA: Diagnosis present

## 2018-09-07 DIAGNOSIS — S32592D Other specified fracture of left pubis, subsequent encounter for fracture with routine healing: Secondary | ICD-10-CM | POA: Diagnosis not present

## 2018-09-07 DIAGNOSIS — W19XXXD Unspecified fall, subsequent encounter: Secondary | ICD-10-CM | POA: Diagnosis present

## 2018-09-07 DIAGNOSIS — K219 Gastro-esophageal reflux disease without esophagitis: Secondary | ICD-10-CM | POA: Diagnosis present

## 2018-09-07 DIAGNOSIS — R634 Abnormal weight loss: Secondary | ICD-10-CM | POA: Diagnosis present

## 2018-09-07 DIAGNOSIS — L89150 Pressure ulcer of sacral region, unstageable: Secondary | ICD-10-CM | POA: Diagnosis present

## 2018-09-07 DIAGNOSIS — H903 Sensorineural hearing loss, bilateral: Secondary | ICD-10-CM | POA: Diagnosis present

## 2018-09-07 DIAGNOSIS — J9602 Acute respiratory failure with hypercapnia: Secondary | ICD-10-CM | POA: Diagnosis not present

## 2018-09-07 DIAGNOSIS — C170 Malignant neoplasm of duodenum: Secondary | ICD-10-CM | POA: Diagnosis present

## 2018-09-07 DIAGNOSIS — R0602 Shortness of breath: Secondary | ICD-10-CM | POA: Diagnosis present

## 2018-09-07 DIAGNOSIS — R6521 Severe sepsis with septic shock: Secondary | ICD-10-CM | POA: Diagnosis not present

## 2018-09-07 DIAGNOSIS — E440 Moderate protein-calorie malnutrition: Secondary | ICD-10-CM | POA: Diagnosis present

## 2018-09-07 DIAGNOSIS — Z66 Do not resuscitate: Secondary | ICD-10-CM | POA: Diagnosis present

## 2018-09-07 DIAGNOSIS — K311 Adult hypertrophic pyloric stenosis: Secondary | ICD-10-CM

## 2018-09-07 DIAGNOSIS — Z79891 Long term (current) use of opiate analgesic: Secondary | ICD-10-CM | POA: Diagnosis not present

## 2018-09-07 DIAGNOSIS — Z87891 Personal history of nicotine dependence: Secondary | ICD-10-CM | POA: Diagnosis not present

## 2018-09-07 DIAGNOSIS — E876 Hypokalemia: Secondary | ICD-10-CM | POA: Diagnosis present

## 2018-09-07 DIAGNOSIS — B3781 Candidal esophagitis: Secondary | ICD-10-CM | POA: Diagnosis present

## 2018-09-07 DIAGNOSIS — J690 Pneumonitis due to inhalation of food and vomit: Secondary | ICD-10-CM

## 2018-09-07 DIAGNOSIS — Z7189 Other specified counseling: Secondary | ICD-10-CM | POA: Diagnosis not present

## 2018-09-07 DIAGNOSIS — J9601 Acute respiratory failure with hypoxia: Secondary | ICD-10-CM | POA: Diagnosis not present

## 2018-09-07 DIAGNOSIS — I340 Nonrheumatic mitral (valve) insufficiency: Secondary | ICD-10-CM | POA: Diagnosis not present

## 2018-09-07 DIAGNOSIS — K3189 Other diseases of stomach and duodenum: Secondary | ICD-10-CM | POA: Diagnosis not present

## 2018-09-07 DIAGNOSIS — K8689 Other specified diseases of pancreas: Secondary | ICD-10-CM | POA: Diagnosis present

## 2018-09-07 DIAGNOSIS — E785 Hyperlipidemia, unspecified: Secondary | ICD-10-CM | POA: Diagnosis present

## 2018-09-07 DIAGNOSIS — A419 Sepsis, unspecified organism: Secondary | ICD-10-CM | POA: Diagnosis not present

## 2018-09-07 DIAGNOSIS — I10 Essential (primary) hypertension: Secondary | ICD-10-CM | POA: Diagnosis present

## 2018-09-07 DIAGNOSIS — T07XXXA Unspecified multiple injuries, initial encounter: Secondary | ICD-10-CM

## 2018-09-07 DIAGNOSIS — K2970 Gastritis, unspecified, without bleeding: Secondary | ICD-10-CM | POA: Diagnosis present

## 2018-09-07 DIAGNOSIS — N400 Enlarged prostate without lower urinary tract symptoms: Secondary | ICD-10-CM | POA: Diagnosis present

## 2018-09-07 DIAGNOSIS — Z515 Encounter for palliative care: Secondary | ICD-10-CM | POA: Diagnosis not present

## 2018-09-07 DIAGNOSIS — Z96641 Presence of right artificial hip joint: Secondary | ICD-10-CM | POA: Diagnosis present

## 2018-09-07 LAB — BLOOD GAS, ARTERIAL
Acid-Base Excess: 2.8 mmol/L — ABNORMAL HIGH (ref 0.0–2.0)
BICARBONATE: 27.6 mmol/L (ref 20.0–28.0)
Drawn by: 225631
FIO2: 60
MECHVT: 0.62 mL
O2 Saturation: 98.7 %
PEEP: 5 cmH2O
Patient temperature: 98.6
RATE: 16 resp/min
pCO2 arterial: 45.4 mmHg (ref 32.0–48.0)
pH, Arterial: 7.401 (ref 7.350–7.450)
pO2, Arterial: 141 mmHg — ABNORMAL HIGH (ref 83.0–108.0)

## 2018-09-07 LAB — GLUCOSE, CAPILLARY
GLUCOSE-CAPILLARY: 111 mg/dL — AB (ref 70–99)
Glucose-Capillary: 129 mg/dL — ABNORMAL HIGH (ref 70–99)
Glucose-Capillary: 118 mg/dL — ABNORMAL HIGH (ref 70–99)

## 2018-09-07 LAB — INFLUENZA PANEL BY PCR (TYPE A & B)
Influenza A By PCR: NEGATIVE
Influenza B By PCR: NEGATIVE

## 2018-09-07 LAB — MAGNESIUM: Magnesium: 1.8 mg/dL (ref 1.7–2.4)

## 2018-09-07 LAB — URINALYSIS, ROUTINE W REFLEX MICROSCOPIC
BILIRUBIN URINE: NEGATIVE
Glucose, UA: NEGATIVE mg/dL
HGB URINE DIPSTICK: NEGATIVE
Ketones, ur: 5 mg/dL — AB
LEUKOCYTES UA: NEGATIVE
Nitrite: NEGATIVE
Protein, ur: 30 mg/dL — AB
Specific Gravity, Urine: 1.03 (ref 1.005–1.030)
pH: 5 (ref 5.0–8.0)

## 2018-09-07 LAB — BASIC METABOLIC PANEL
Anion gap: 15 (ref 5–15)
BUN: 26 mg/dL — ABNORMAL HIGH (ref 8–23)
CO2: 26 mmol/L (ref 22–32)
Calcium: 8.9 mg/dL (ref 8.9–10.3)
Chloride: 96 mmol/L — ABNORMAL LOW (ref 98–111)
Creatinine, Ser: 1.13 mg/dL (ref 0.61–1.24)
GFR calc Af Amer: >60 mL/min (ref >60)
GFR calc non Af Amer: 58 mL/min — ABNORMAL LOW (ref >60)
GLUCOSE: 166 mg/dL — AB (ref 70–99)
Potassium: 2.9 mmol/L — ABNORMAL LOW (ref 3.5–5.1)
Sodium: 137 mmol/L (ref 135–145)

## 2018-09-07 LAB — LACTIC ACID, PLASMA: LACTIC ACID, VENOUS: 3.2 mmol/L — AB (ref 0.5–1.9)

## 2018-09-07 LAB — CBC
HCT: 39.9 % (ref 39.0–52.0)
Hemoglobin: 13 g/dL (ref 13.0–17.0)
MCH: 30.7 pg (ref 26.0–34.0)
MCHC: 32.6 g/dL (ref 30.0–36.0)
MCV: 94.1 fL (ref 80.0–100.0)
Platelets: 216 10*3/uL (ref 150–400)
RBC: 4.24 MIL/uL (ref 4.22–5.81)
RDW: 14.2 % (ref 11.5–15.5)
WBC: 12.1 10*3/uL — ABNORMAL HIGH (ref 4.0–10.5)
nRBC: 0 % (ref 0.0–0.2)

## 2018-09-07 LAB — PREALBUMIN: Prealbumin: 17.7 mg/dL — ABNORMAL LOW (ref 18–38)

## 2018-09-07 LAB — PHOSPHORUS: Phosphorus: 3.5 mg/dL (ref 2.5–4.6)

## 2018-09-07 LAB — TRIGLYCERIDES: Triglycerides: 65 mg/dL (ref <150)

## 2018-09-07 MED ORDER — FENTANYL CITRATE (PF) 100 MCG/2ML IJ SOLN
50.0000 ug | INTRAMUSCULAR | Status: DC | PRN
  Administered 2018-09-08 – 2018-09-10 (×10): 50 ug via INTRAVENOUS
  Filled 2018-09-07 (×11): qty 2

## 2018-09-07 MED ORDER — LACTATED RINGERS IV SOLN
INTRAVENOUS | Status: AC
  Administered 2018-09-07: 125 mL/h via INTRAVENOUS
  Administered 2018-09-07: 05:00:00 via INTRAVENOUS
  Administered 2018-09-07: 125 mL/h via INTRAVENOUS
  Administered 2018-09-08: 02:00:00 via INTRAVENOUS

## 2018-09-07 MED ORDER — ORAL CARE MOUTH RINSE
15.0000 mL | OROMUCOSAL | Status: DC
  Administered 2018-09-07 – 2018-09-12 (×42): 15 mL via OROMUCOSAL

## 2018-09-07 MED ORDER — SODIUM CHLORIDE 0.9 % IV SOLN
1.0000 g | Freq: Every day | INTRAVENOUS | Status: DC
  Administered 2018-09-07 – 2018-09-11 (×6): 1 g via INTRAVENOUS
  Filled 2018-09-07: qty 1
  Filled 2018-09-07: qty 10
  Filled 2018-09-07 (×2): qty 1
  Filled 2018-09-07: qty 10
  Filled 2018-09-07 (×2): qty 1

## 2018-09-07 MED ORDER — LIDOCAINE HCL URETHRAL/MUCOSAL 2 % EX GEL
1.0000 "application " | Freq: Once | CUTANEOUS | Status: AC
  Administered 2018-09-07: 1
  Filled 2018-09-07: qty 5

## 2018-09-07 MED ORDER — FAMOTIDINE IN NACL 20-0.9 MG/50ML-% IV SOLN
20.0000 mg | Freq: Two times a day (BID) | INTRAVENOUS | Status: DC
  Administered 2018-09-07 (×3): 20 mg via INTRAVENOUS
  Filled 2018-09-07 (×3): qty 50

## 2018-09-07 MED ORDER — SODIUM CHLORIDE 0.9 % IV SOLN
500.0000 mg | Freq: Every day | INTRAVENOUS | Status: AC
  Administered 2018-09-07 – 2018-09-08 (×3): 500 mg via INTRAVENOUS
  Filled 2018-09-07 (×3): qty 500

## 2018-09-07 MED ORDER — POTASSIUM CHLORIDE 10 MEQ/100ML IV SOLN
10.0000 meq | INTRAVENOUS | Status: AC
  Administered 2018-09-07 (×5): 10 meq via INTRAVENOUS
  Filled 2018-09-07 (×5): qty 100

## 2018-09-07 MED ORDER — CHLORHEXIDINE GLUCONATE 0.12% ORAL RINSE (MEDLINE KIT)
15.0000 mL | Freq: Two times a day (BID) | OROMUCOSAL | Status: DC
  Administered 2018-09-07 – 2018-09-11 (×9): 15 mL via OROMUCOSAL

## 2018-09-07 MED ORDER — ONDANSETRON HCL 4 MG/2ML IJ SOLN: 4.0000 mg | Freq: Four times a day (QID) | INTRAMUSCULAR | Status: DC | PRN

## 2018-09-07 MED ORDER — FENTANYL CITRATE (PF) 100 MCG/2ML IJ SOLN
50.0000 ug | INTRAMUSCULAR | Status: AC | PRN
  Administered 2018-09-07 – 2018-09-10 (×3): 50 ug via INTRAVENOUS
  Filled 2018-09-07 (×3): qty 2

## 2018-09-07 MED ORDER — PROPOFOL 1000 MG/100ML IV EMUL
0.0000 ug/kg/min | INTRAVENOUS | Status: DC
  Administered 2018-09-07 (×2): 30 ug/kg/min via INTRAVENOUS
  Administered 2018-09-07: 35 ug/kg/min via INTRAVENOUS
  Administered 2018-09-07: 20 ug/kg/min via INTRAVENOUS
  Administered 2018-09-08: 35 ug/kg/min via INTRAVENOUS
  Administered 2018-09-08 (×2): 30 ug/kg/min via INTRAVENOUS
  Administered 2018-09-08: 45 ug/kg/min via INTRAVENOUS
  Administered 2018-09-09 (×3): 35 ug/kg/min via INTRAVENOUS
  Administered 2018-09-10: 10 ug/kg/min via INTRAVENOUS
  Administered 2018-09-10 (×2): 35 ug/kg/min via INTRAVENOUS
  Administered 2018-09-10: 30 ug/kg/min via INTRAVENOUS
  Administered 2018-09-10 – 2018-09-11 (×2): 35 ug/kg/min via INTRAVENOUS
  Administered 2018-09-11: 25 ug/kg/min via INTRAVENOUS
  Administered 2018-09-11 (×2): 35 ug/kg/min via INTRAVENOUS
  Administered 2018-09-12: 25 ug/kg/min via INTRAVENOUS
  Filled 2018-09-07 (×23): qty 100

## 2018-09-07 NOTE — H&P (View-Only) (Signed)
Referring Provider: PCCM Primary Care Physician:  [facility] [place] Primary Gastroenterologist:  unassigned     Reason for Consultation:   Gastric outlet obstructioin    ASSESSMENT / PLAN:    33.  85 year old male with abdominal pain, weight loss and new onset nausea / vomiting.  CT compatible with gastric outlet obstruction.  -Continue NG tube decompression.  Based on INO in record, patient has had about 1500 cc NG tube output so far.  -Patient will need EGD in the next day or so for further evaluation.  He could possibly need stent placement at that time depending on findings.  I briefly discussed this with patient's wife at bedside.  Patient being treated for aspiration pneumonia, he is having fevers and is hypokalemic . Plan is to proceed with EGD when medically stable, hopefully while he is still intubated   2.  Respiratory failure /pneumonia (aspiration versus CAP).  Intubated and ventilated. On IV antibiotics.  3.  Multiple subacute fractures on CT scan   HPI:      HPI: Andres Cruz is a 85 y.o. male was was brought to ED by EMS last night for evaluation of abdominal pain, vomiting .  She is intubated unable to provide history.  History comes from wife at bedside and medical record. Abdominal pain started a few weeks ago and it was associated with anorexia and significant weight loss.  A couple of days ago patient began vomiting, he vomited numerous times a day.  Emesis mainly green to brown liquid. He has been coughing at home, productive of white sputum. Finally last night patient started having shaking chills, told his wife he could not take it anymore and EMS was called.  CT scan just gastric outlet obstruction.  Per wife patient does not take NSAIDs, no history of peptic ulcer disease.  Pepcid is listed on home med list . Overall, no chronic GI complaints or problems.   ED course:  Temp 100.8, HR 100, BP 101/68, 02 sat 95%  K+ 2.9 CTAP and chest  >>> GOO with massive distention of the stomach and proximal duodenum.  August distended with fluid levels, bronchopneumonia or possibly aspiration, thoracic AAA measuring 4.3 cm, subacute fractures of the pubic rami and sacrum, multiple thoracic and L1 compression deformities   Past Medical History:  Diagnosis Date  . Other specified disorders of prostate Pancreatic insufficiency Hyperlipidemia     Past Surgical History:  Procedure Laterality Date   Hip surgery      Prior to Admission medications   Medication Sig Start Date End Date Taking? Authorizing Provider  atorvastatin (LIPITOR) 10 MG tablet Take 10 mg by mouth daily.   Yes [provider]  busPIRone (BUSPAR) 10 MG tablet Take 20 mg by mouth at bedtime.    Yes [provider]  busPIRone (BUSPAR) 5 MG tablet Take 10 mg by mouth daily.   Yes [provider]  calcium carbonate (OSCAL) 1500 (600 Ca) MG TABS tablet Take 1,200 mg of elemental calcium by mouth 2 (two) times daily with a meal.   Yes [provider]  Ergocalciferol (VITAMIN D2) 50 MCG (2000 UT) TABS Take 1 capsule by mouth daily.   Yes [provider]  famotidine (PEPCID) 10 MG tablet Take 10 mg by mouth 2 (two) times daily.   Yes [provider]  gabapentin (NEURONTIN) 600 MG tablet Take 600 mg by mouth 2 (two) times daily.  08/12/17  Yes [provider]  hydrochlorothiazide (HYDRODIURIL) 25 MG tablet Take 25 mg by mouth daily.   Yes [provider]  HYDROcodone-acetaminophen (NORCO/VICODIN) 5-325 MG tablet Take 1 tablet by mouth every 6 (six) hours as needed for moderate pain.  08/30/17  Yes [provider]  Pancrelipase, Lip-Prot-Amyl, 24000-76000 units CPEP Take 24,000 Units by mouth 3 (three) times daily.    Yes [provider]  senna-docusate (SENOKOT-S) 8.6-50 MG tablet Take 1 tablet by mouth daily.   Yes [provider]  tamsulosin (FLOMAX) 0.4 MG CAPS capsule Take 0.4 mg  by mouth daily.   Yes [provider]  triamcinolone cream (KENALOG) 0.1 % Apply 1 application topically 2 (two) times daily as needed for rash.   Yes [provider]  oxyCODONE (ROXICODONE) 5 MG immediate release tablet Take 1 tablet (5 mg total) by mouth every 4 (four) hours as needed for severe pain. Patient not taking: Reported on 09/07/2018 08/06/18   Gareth Morgan, MD    Current Facility-Administered Medications  Medication Dose Route Frequency Provider Last Rate Last Dose  . azithromycin (ZITHROMAX) 500 mg in sodium chloride 0.9 % 250 mL IVPB  500 mg Intravenous QHS Crista Luria, MD 250 mL/hr at 09/07/18 0558 500 mg at 09/07/18 0558  . cefTRIAXone (ROCEPHIN) 1 g in sodium chloride 0.9 % 100 mL IVPB  1 g Intravenous QHS Crista Luria, MD 200 mL/hr at 09/07/18 0759 1 g at 09/07/18 0759  . famotidine (PEPCID) IVPB 20 mg premix  20 mg Intravenous Q12H Crista Luria, MD   Stopped at 09/07/18 [phone]  . fentaNYL (SUBLIMAZE) injection 50 mcg  50 mcg Intravenous Q15 min PRN Crista Luria, MD   50 mcg at 09/07/18 0221  . fentaNYL (SUBLIMAZE) injection 50 mcg  50 mcg Intravenous Q2H PRN Crista Luria, MD      . lactated ringers infusion   Intravenous Continuous Crista Luria, MD 125 mL/hr at 09/07/18 [phone]    . ondansetron (ZOFRAN) injection 4 mg  4 mg Intravenous Q6H PRN Crista Luria, MD      . propofol (DIPRIVAN) 1000 MG/100ML infusion  0-50 mcg/kg/min Intravenous Continuous Crista Luria, MD 17.05 mL/hr at 09/07/18 0804 35 mcg/kg/min at 09/07/18 0804   Current Outpatient Medications  Medication Sig Dispense Refill  . atorvastatin (LIPITOR) 10 MG tablet Take 10 mg by mouth daily.    . busPIRone (BUSPAR) 10 MG tablet Take 20 mg by mouth at bedtime.     . busPIRone (BUSPAR) 5 MG tablet Take 10 mg by mouth daily.    . calcium carbonate (OSCAL) 1500 (600 Ca) MG TABS tablet Take 1,200 mg of elemental calcium by mouth 2 (two)  times daily with a meal.    . Ergocalciferol (VITAMIN D2) 50 MCG (2000 UT) TABS Take 1 capsule by mouth daily.    . famotidine (PEPCID) 10 MG tablet Take 10 mg by mouth 2 (two) times daily.    Marland Kitchen gabapentin (NEURONTIN) 600 MG tablet Take 600 mg by mouth 2 (two) times daily.     . hydrochlorothiazide (HYDRODIURIL) 25 MG tablet Take 25 mg by mouth daily.    Marland Kitchen HYDROcodone-acetaminophen (NORCO/VICODIN) 5-325 MG tablet Take 1 tablet by mouth every 6 (six) hours as needed for moderate pain.     . Pancrelipase, Lip-Prot-Amyl, 24000-76000 units CPEP Take 24,000 Units by mouth 3 (three) times daily.     Marland Kitchen senna-docusate (SENOKOT-S) 8.6-50 MG tablet Take 1 tablet by mouth daily.    . tamsulosin (  FLOMAX) 0.4 MG CAPS capsule Take 0.4 mg by mouth daily.    Marland Kitchen triamcinolone cream (KENALOG) 0.1 % Apply 1 application topically 2 (two) times daily as needed for rash.    . oxyCODONE (ROXICODONE) 5 MG immediate release tablet Take 1 tablet (5 mg total) by mouth every 4 (four) hours as needed for severe pain. (Patient not taking: Reported on 09/07/2018) 15 tablet 0    Allergies as of 08/30/2018  . (No Known Allergies)    No family history on file.  Social History   Socioeconomic History  . Marital status: Married    Spouse name: Not on file  . Number of children: Not on file  . Years of education: Not on file  . Highest education level: Not on file  Occupational History  . Not on file  Social Needs  . Financial resource strain: Not on file  . Food insecurity:    Worry: Not on file    Inability: Not on file  . Transportation needs:    Medical: Not on file    Non-medical: Not on file  Tobacco Use  . Smoking status: Former Smoker    Types: Cigarettes    Last attempt to quit: 08/06/1998    Years since quitting: 20.1  . Smokeless tobacco: Never Used  Substance and Sexual Activity  . Alcohol use: Not Currently  . Drug use: Never  . Sexual activity: Not on file  Lifestyle  . Physical activity:     Days per week: Not on file    Minutes per session: Not on file  . Stress: Not on file  Relationships  . Social connections:    Talks on phone: Not on file    Gets together: Not on file    Attends religious service: Not on file    Active member of club or organization: Not on file    Attends meetings of clubs or organizations: Not on file    Relationship status: Not on file  . Intimate partner violence:    Fear of current or ex partner: Not on file    Emotionally abused: Not on file    Physically abused: Not on file    Forced sexual activity: Not on file  Other Topics Concern  . Not on file  Social History Narrative  . Not on file    Review of Systems: All systems reviewed and negative except where noted in HPI.  Physical Exam: Vital signs in last 24 hours: Temp:  [97.7 F (36.5 C)-100.8 F (38.2 C)] 100.6 F (38.1 C) (02/01 0700) Pulse Rate:  [96-128] 99 (02/01 0700) Resp:  [11-47] 16 (02/01 0700) BP: (100-162)/(68-96) 101/68 (02/01 0700) SpO2:  [80 %-100 %] 95 % (02/01 0837) FiO2 (%):  [40 %-100 %] 40 % (02/01 0837) Weight:  [81.2 kg] 81.2 kg (02/01 0158)   General:   Thin, elderly male lying in bed intubated in NAD Psych:  Unable to assess. Eyes:  Pupils equal, sclera clear, no icterus.   Conjunctiva pink. Nose:  No deformity, discharge,  or lesions. Neck:  Supple; no masses. OGT in place Lungs:  Intubated / ventilated. Chest clear.  Heart:  Regular rate and rhythm no lower extremity edema Abdomen:  Soft, non-distended, nontender, BS active, no palp mass    Rectal:  Deferred  Msk:  Symmetrical without gross deformities. . Neurologic:  sedated Skin:  Intact without obvious significant lesions or rashes.   Intake/Output from previous day: 01/31 0701 - 02/01 0700 In: -  Out: 3000 [Emesis/NG output:1500] Intake/Output this shift: No intake/output data recorded.  Lab Results: Recent Labs    08/07/2018 2235 09/07/18 0500  WBC 15.8* 12.1*  HGB 13.3 13.0  HCT  42.0 39.9  PLT 272 216   BMET Recent Labs    08/14/2018 2235 09/07/18 0500  NA 138 137  K 3.7 2.9*  CL 96* 96*  CO2 27 26  GLUCOSE 187* 166*  BUN 24* 26*  CREATININE 1.16 1.13  CALCIUM 9.5 8.9   LFT Recent Labs    09/01/2018 2235  PROT 7.8  ALBUMIN 4.4  AST 29  ALT 12  ALKPHOS 186*  BILITOT 1.5*   PT/INR Recent Labs    09/03/2018 2235  LABPROT 12.6  INR 0.96   Hepatitis Panel No results for input(s): HEPBSAG, HCVAB, HEPAIGM, HEPBIGM in the last 72 hours.    Studies/Results: Ct Abdomen Pelvis Wo Contrast  Result Date: 09/07/2018 CLINICAL DATA:  85 y/o M; chest and abdominal pain. Abdominal distention. Dyspnea. Nausea and vomiting. EXAM: CT CHEST, ABDOMEN AND PELVIS WITHOUT CONTRAST TECHNIQUE: Multidetector CT imaging of the chest, abdomen and pelvis was performed following the standard protocol without IV contrast. COMPARISON:  None. FINDINGS: CT CHEST FINDINGS Cardiovascular: Normal heart size. No pericardial effusion. Coronary artery calcific atherosclerosis. Thoracic aorta measures up to 4.3 cm. Mediastinum/Nodes: Marked distention of the esophagus with fluid level likely due to downstream obstruction. Normal thyroid. No mediastinal or axillary adenopathy. Lungs/Pleura: Patchy areas of consolidation within the left upper lobe lingula and right lower lobe with clustered nodules compatible with bronchopneumonia. Findings may represent aspiration. No pleural effusion or pneumothorax. Musculoskeletal: T8, T9, T11, T12, L1 compression deformities, age indeterminate. T11 augmentation. L4-5 grade 1 anterolisthesis. CT ABDOMEN PELVIS FINDINGS Hepatobiliary: Pneumobilia within the left lobe of the liver. Otherwise no focal liver abnormality is seen. No gallstones, gallbladder wall thickening, or biliary dilatation. Pancreas: Unremarkable. No pancreatic ductal dilatation or surrounding inflammatory changes. Spleen: Normal in size without focal abnormality. Adrenals/Urinary Tract: Normal  adrenal glands. Multiple renal cysts bilaterally measuring up to 6.3 cm arising from the left kidney upper pole. No urinary stone disease or hydronephrosis. Bladder is unremarkable. Stomach/Bowel: Massive distention of the stomach and proximal duodenum to the level of the junction between second and third segments of the duodenum (series 4, image 94). Massive distention of the stomach elevates the left hemidiaphragm. No obstructive or inflammatory changes of the small and large bowel downstream to the duodenum. Sigmoid diverticulosis. Normal appendix. Vascular/Lymphatic: Aortic atherosclerosis. No enlarged abdominal or pelvic lymph nodes. Reproductive: Prostate is unremarkable. Other: Small left inguinal hernia containing fat. Musculoskeletal: Right total hip prosthesis without periprosthetic lucency or fracture identified, incompletely visualized. The acetabular screw traverses the medial cortex of the acetabulum extending into the iliopsoas muscle (series 4, image 104). Subacute fractures with callus formation involving the left superior and inferior pubic rami. Right-greater-than-left sacral fractures. IMPRESSION: 1. Massive distention of the stomach and proximal duodenum. Findings may represent a stricture or obstructive mass of the duodenum. The distended stomach elevates the left hemidiaphragm and displaces rightward the mediastinum. 2. Distention of esophagus with fluid levels likely due to downstream duodenal obstruction. 3. Left upper lobe lingula and right lower lobe consolidation may reflect bronchopneumonia or possibly aspiration. 4. Thoracic aortic aneurysm measuring up to 4.3 cm. Recommend annual imaging followup by CTA or MRA. This recommendation follows 2010 ACCF/AHA/AATS/ACR/ASA/SCA/SCAI/SIR/STS/SVM Guidelines for the Diagnosis and Management of Patients with Thoracic Aortic Disease. 2010; 121: Y185-U314. 5. Coronary artery and aortic calcific 6. Subacute  fractures of left superior and inferior  pubic rami as well as right-greater-than-left sacrum. 7. T8, T9, T11, T12, L1 compression deformities, age indeterminate. Electronically Signed   By: Kristine Garbe M.D.   On: 09/07/2018 00:39   Dg Chest 2 View  Result Date: 08/08/2018 CLINICAL DATA:  Shortness of breath EXAM: CHEST - 2 VIEW COMPARISON:  None. FINDINGS: Marked elevation of the left hemidiaphragm and gaseous distention of the stomach beneath the left hemidiaphragm. Compressive atelectasis in the left lower lobe. Low volumes on the right without confluent airspace opacity. Heart appears to be normal size. No visible effusions or pneumothorax. No acute bony abnormality. IMPRESSION: Marked gaseous distention of the stomach and elevation of the left hemidiaphragm. Compressive atelectasis in the left lower lobe. Very low lung volumes. Electronically Signed   By: Rolm Baptise M.D.   On: 09/02/2018 23:11   Dg Abdomen 1 View  Result Date: 09/07/2018 CLINICAL DATA:  Orogastric tube placement EXAM: ABDOMEN - 1 VIEW COMPARISON:  CT 08/25/2018 FINDINGS: The tip and side port of a gastric tube are noted in the left upper quadrant in the expected location of the distended stomach. Stool is seen within nonobstructed, nondistended large bowel. Atelectasis is seen at the lung bases. Partially included right hip arthroplasty. IMPRESSION: Gastric tube with tip and side-port are seen in the expected location of the stomach. Electronically Signed   By: Ashley Royalty M.D.   On: 09/07/2018 02:20   Ct Chest Wo Contrast  Result Date: 09/07/2018 CLINICAL DATA:  85 y/o M; chest and abdominal pain. Abdominal distention. Dyspnea. Nausea and vomiting. EXAM: CT CHEST, ABDOMEN AND PELVIS WITHOUT CONTRAST TECHNIQUE: Multidetector CT imaging of the chest, abdomen and pelvis was performed following the standard protocol without IV contrast. COMPARISON:  None. FINDINGS: CT CHEST FINDINGS Cardiovascular: Normal heart size. No pericardial effusion. Coronary artery  calcific atherosclerosis. Thoracic aorta measures up to 4.3 cm. Mediastinum/Nodes: Marked distention of the esophagus with fluid level likely due to downstream obstruction. Normal thyroid. No mediastinal or axillary adenopathy. Lungs/Pleura: Patchy areas of consolidation within the left upper lobe lingula and right lower lobe with clustered nodules compatible with bronchopneumonia. Findings may represent aspiration. No pleural effusion or pneumothorax. Musculoskeletal: T8, T9, T11, T12, L1 compression deformities, age indeterminate. T11 augmentation. L4-5 grade 1 anterolisthesis. CT ABDOMEN PELVIS FINDINGS Hepatobiliary: Pneumobilia within the left lobe of the liver. Otherwise no focal liver abnormality is seen. No gallstones, gallbladder wall thickening, or biliary dilatation. Pancreas: Unremarkable. No pancreatic ductal dilatation or surrounding inflammatory changes. Spleen: Normal in size without focal abnormality. Adrenals/Urinary Tract: Normal adrenal glands. Multiple renal cysts bilaterally measuring up to 6.3 cm arising from the left kidney upper pole. No urinary stone disease or hydronephrosis. Bladder is unremarkable. Stomach/Bowel: Massive distention of the stomach and proximal duodenum to the level of the junction between second and third segments of the duodenum (series 4, image 94). Massive distention of the stomach elevates the left hemidiaphragm. No obstructive or inflammatory changes of the small and large bowel downstream to the duodenum. Sigmoid diverticulosis. Normal appendix. Vascular/Lymphatic: Aortic atherosclerosis. No enlarged abdominal or pelvic lymph nodes. Reproductive: Prostate is unremarkable. Other: Small left inguinal hernia containing fat. Musculoskeletal: Right total hip prosthesis without periprosthetic lucency or fracture identified, incompletely visualized. The acetabular screw traverses the medial cortex of the acetabulum extending into the iliopsoas muscle (series 4, image 104).  Subacute fractures with callus formation involving the left superior and inferior pubic rami. Right-greater-than-left sacral fractures. IMPRESSION: 1. Massive distention of the  stomach and proximal duodenum. Findings may represent a stricture or obstructive mass of the duodenum. The distended stomach elevates the left hemidiaphragm and displaces rightward the mediastinum. 2. Distention of esophagus with fluid levels likely due to downstream duodenal obstruction. 3. Left upper lobe lingula and right lower lobe consolidation may reflect bronchopneumonia or possibly aspiration. 4. Thoracic aortic aneurysm measuring up to 4.3 cm. Recommend annual imaging followup by CTA or MRA. This recommendation follows 2010 ACCF/AHA/AATS/ACR/ASA/SCA/SCAI/SIR/STS/SVM Guidelines for the Diagnosis and Management of Patients with Thoracic Aortic Disease. 2010; 121: [id number]. 5. Coronary artery and aortic calcific 6. Subacute fractures of left superior and inferior pubic rami as well as right-greater-than-left sacrum. 7. T8, T9, T11, T12, L1 compression deformities, age indeterminate. Electronically Signed   By: Kristine Garbe M.D.   On: 09/07/2018 00:39   Dg Chest Port 1 View  Result Date: 09/07/2018 CLINICAL DATA:  Post intubation EXAM: PORTABLE CHEST 1 VIEW COMPARISON:  09/03/2018 FINDINGS: Endotracheal tube is been placed with tip measuring 5.4 cm above the carina. Enteric tube is been placed with tip over the left lower chest consistent with location in the stomach which on the previous study was demonstrated below and elevated left hemidiaphragm. The stomach has decompressed since the previous study. There is a shallow inspiration. Atelectasis or infiltration in both lung bases with probable small pleural effusions. Heart size is normal. No pneumothorax. Degenerative changes in the shoulders. IMPRESSION: Appliances appear in satisfactory location. Shallow inspiration with atelectasis or infiltration in the lung bases  and small bilateral pleural effusions. Electronically Signed   By: Lucienne Capers M.D.   On: 09/07/2018 02:16     Tye Savoy, NP-C @  09/07/2018, 8:48 AM

## 2018-09-07 NOTE — Progress Notes (Signed)
Pt's spouse stated she did not want [place] to do CPR on pt, she stated she had discussion with daughter and they decided to change his code status. I paged and spoke with Dr Lamonte Sakai, he stated he would come speak with spouse at bedside and address code status.  Also made him aware of K+=2.9

## 2018-09-07 NOTE — H&P (Addendum)
NAME:  Andres Cruz, MRN:  [id number], DOB:  14-Dec-1928, LOS: 0 ADMISSION DATE:  08/22/2018, CONSULTATION DATE:  09/07/2018 REFERRING MD:  ED CHIEF COMPLAINT:  Nausea, vomiting  Brief History   85 year old who presented to the ED with nausea and vomiting and found to have gastric outlet obstruction.   History of present illness   85 year old with history of BPH, hypertension, and recent fall sustaining pubic fractures who presented with nausea vomiting and abdominal pain.  Patient is intubated.  History is obtained from wife who is at the bedside as well as chart review.  Per patient's wife patient was in his usual state of health until 3 to 4 days ago.  Patient started experiencing nausea and vomiting.  She states that patient was also complaining of epigastric pain.  States that the vomiting got worse and patient was not able to keep anything down including water.  She also reports a few episodes of diarrhea.  She denies hematemesis.  She also endorses cough and sputum production.  She denies fever but states that the patient had experienced chills.   Patient presented to the ED where CT abdomen and pelvis done showed massive distention of the stomach and proximal duodenum.  The distended stomach elevates the left hemidiaphragm and displaces the mediastinum to the right.  His chest CT was also concerning for left upper lobe and right lower lobe consolidation.  Patient was intubated in the ED due to difficulty inserting NG for decompression and put on propofol. OG tube was place and put on suction.  Surgery was consulted  Past Medical History  -Hypertension -Hyperlipidemia -Pancreatic insufficiency -BPH  Significant Hospital Events   -1/31-intubated  Consults:  -Surgery consult -PCCM  Procedures:  -1/31-intubation  Significant Diagnostic Tests:  -CT chest, abdomen and Pelvis  Massive distention of the stomach and proximal duodenum. Findings may represent a stricture or obstructive  mass of the duodenum. The distended stomach elevates the left hemidiaphragm and displaces rightward the mediastinum  Micro Data:  -Pending   Antimicrobials:  -None    Objective   Blood pressure 121/77, pulse (!) 109, temperature 98.8 F (37.1 C), resp. rate 16, height 6' (1.829 m), weight 81.2 kg, SpO2 96 %.    Vent Mode: PRVC FiO2 (%):  [60 %-100 %] 60 % Set Rate:  [16 bmp] 16 bmp Vt Set:  [520 mL-620 mL] 620 mL PEEP:  [5 cmH20] 5 cmH20 Plateau Pressure:  [15 cmH20] 15 cmH20   Intake/Output Summary (Last 24 hours) at 09/07/2018 0331 Last data filed at 09/07/2018 0158 Gross per 24 hour  Intake -  Output 3000 ml  Net -3000 ml   Filed Weights   09/07/18 0158  Weight: 81.2 kg    Examination: General: Elderly gentleman, lying in bed, intubated and ventilated HENT: Has endotracheal tube Lungs: Decreased air entry in the left base Cardiovascular: Regular rate, sinus rhythm, heart sounds are normal Abdomen: Abdomen is soft, no organomegaly Extremities: No deformities noted Neuro: Sedated on propofol.  RASS of -3 GU:   Assessment & Plan:  #Gastric outlet obstruction Patient presents with nausea and vomiting and found to have massive distention of the stomach and proximal duodenum concerning for obstruction. -Maintain NG tube suction -Keep n.p.o. -IV fluids; LR 125 cc/h -Surgery has been consulted will evaluate patient  -Monitor electrolytes and replete as appropriate  #Respiratory failure on mechanical ventilation #Pneumonia Patient intubated due to difficulty with NG placement for gastric decompression.  He had been having a cough and imaging  is concerning for left upper lobe and right middle lobe consolidation consistent with pneumonia -Lung protective ventilation strategies -PRN fentanyl for analgesia -continue propofol for sedation, wean sedation to target RASS of -1 -Daily sedation holiday and SBT to evaluate for appropriateness for extubation -We will empirically  cover for CAP with ceftriaxone and azithromycin -Obtain tracheal aspirate for culture  #Multiple fractures He sustained a fall back in December.  His imaging shows Subacute fractures of left superior and inferior pubic rami as well as right-greater-than-left sacrum. T8, T9, T11, T12, L1 compression deformities -Conservative management -PT and OT once appropriate  Best practice:  Diet: NPO Pain/Anxiety/Delirium protocol (if indicated): PRN fentanyl VAP protocol (if indicated): HoB elevation, oral care DVT prophylaxis: SCDs, hold pharmacological prophylaxis for now incase he goes to surgery today GI prophylaxis: Famotidine Glucose control: maintain euglycemia Mobility: PT/OT once appropriate Code Status: Full code  Family Communication: Updated wife at bedside  Disposition: Admit to ICU  Labs   CBC: Recent Labs  Lab 08/07/2018 2235  WBC 15.8*  NEUTROABS 12.9*  HGB 13.3  HCT 42.0  MCV 93.1  PLT 071    Basic Metabolic Panel: Recent Labs  Lab 08/25/2018 2235  NA 138  K 3.7  CL 96*  CO2 27  GLUCOSE 187*  BUN 24*  CREATININE 1.16  CALCIUM 9.5   GFR: Estimated Creatinine Clearance: 49.2 mL/min (by C-G formula based on SCr of 1.16 mg/dL). Recent Labs  Lab 09/03/2018 2235  WBC 15.8*  LATICACIDVEN 2.9*    Liver Function Tests: Recent Labs  Lab 08/17/2018 2235  AST 29  ALT 12  ALKPHOS 186*  BILITOT 1.5*  PROT 7.8  ALBUMIN 4.4   No results for input(s): LIPASE, AMYLASE in the last 168 hours. No results for input(s): AMMONIA in the last 168 hours.  ABG    Component Value Date/Time   PHART 7.401 09/07/2018 0230   PCO2ART 45.4 09/07/2018 0230   PO2ART 141 (H) 09/07/2018 0230   HCO3 27.6 09/07/2018 0230   O2SAT 98.7 09/07/2018 0230     Coagulation Profile: Recent Labs  Lab 09/05/2018 2235  INR 0.96    Cardiac Enzymes: No results for input(s): CKTOTAL, CKMB, CKMBINDEX, TROPONINI in the last 168 hours.  HbA1C: No results found for: HGBA1C  CBG: No  results for input(s): GLUCAP in the last 168 hours.  Review of Systems:   Unable to obtain as patient is intubated and sedated  Past Medical History  He,  has a past medical history of Other specified disorders of prostate.   Surgical History    Past Surgical History:  Procedure Laterality Date  . HIP SURGERY       Social History   reports that he quit smoking about 20 years ago. His smoking use included cigarettes. He has never used smokeless tobacco. He reports previous alcohol use. He reports that he does not use drugs.   Family History   His family history is not on file.   Allergies No Known Allergies   Home Medications  Prior to Admission medications   Medication Sig Start Date End Date Taking? Authorizing Provider  atorvastatin (LIPITOR) 10 MG tablet Take 10 mg by mouth daily.    [provider]  busPIRone (BUSPAR) 10 MG tablet Take 20 mg by mouth at bedtime.     [provider]  busPIRone (BUSPAR) 5 MG tablet Take 10 mg by mouth daily.    [provider]  famotidine (PEPCID) 10 MG tablet Take 10 mg  by mouth 2 (two) times daily.    [provider]  gabapentin (NEURONTIN) 600 MG tablet Take 600 mg by mouth 2 (two) times daily.  08/12/17   [provider]  hydrochlorothiazide (HYDRODIURIL) 25 MG tablet Take 25 mg by mouth daily.    [provider]  HYDROcodone-acetaminophen (NORCO/VICODIN) 5-325 MG tablet Take 1 tablet by mouth every 6 (six) hours as needed for moderate pain.  08/30/17   [provider]  Hypromellose (ARTIFICIAL TEARS OP) Apply 1 drop to eye daily as needed.    [provider]  meloxicam (MOBIC) 15 MG tablet Take 15 mg by mouth daily as needed for pain.    [provider]  Nutritional Supplements (ENSURE PO) Take 1 Can by mouth daily.    [provider]  omeprazole (PRILOSEC) 20 MG capsule Take 20 mg by mouth daily.    [provider]  oxyCODONE (ROXICODONE) 5 MG  immediate release tablet Take 1 tablet (5 mg total) by mouth every 4 (four) hours as needed for severe pain. 08/06/18   Gareth Morgan, MD  Pancrelipase, Lip-Prot-Amyl, 24000-76000 units CPEP Take 24,000 Units by mouth 3 (three) times daily.     [provider]  senna-docusate (SENOKOT-S) 8.6-50 MG tablet Take 1 tablet by mouth daily.    [provider]  tamsulosin (FLOMAX) 0.4 MG CAPS capsule Take 0.4 mg by mouth daily.    [provider]  triamcinolone cream (KENALOG) 0.1 % Apply 1 application topically 2 (two) times daily as needed for rash.    [provider]  VITAMIN D, CHOLECALCIFEROL, PO Take 2,000 Units by mouth daily.    [provider]     Critical care time: The patient is critically ill with multiple organ systems failure and requires high complexity decision making for assessment and support, frequent evaluation and titration of therapies, application of advanced monitoring technologies and extensive interpretation of multiple databases.   Critical Care Time devoted to patient care services described in this note is  60 Minutes. This time reflects time of care of this signee Dr. Oswald Hillock. This critical care time does not reflect procedure time, or teaching time or supervisory time of PA/NP/Med student/Med Resident etc but could involve care discussion time.  Oswald Hillock, M.D. [facility] Pulmonary/Critical Care Medicine. Pager:  After hours pager: [phone].

## 2018-09-07 NOTE — ED Notes (Signed)
Placed in hospital bed

## 2018-09-07 NOTE — Consult Note (Signed)
Referring Provider: PCCM Primary Care Physician:  Inc, Levasy Primary Gastroenterologist:  unassigned     Reason for Consultation:   Gastric outlet obstructioin    ASSESSMENT / PLAN:    70.  85 year old male with abdominal pain, weight loss and new onset nausea / vomiting.  CT compatible with gastric outlet obstruction.  -Continue NG tube decompression.  Based on INO in record, patient has had about 1500 cc NG tube output so far.  -Patient will need EGD in the next day or so for further evaluation.  He could possibly need stent placement at that time depending on findings.  I briefly discussed this with patient's wife at bedside.  Patient being treated for aspiration pneumonia, he is having fevers and is hypokalemic . Plan is to proceed with EGD when medically stable, hopefully while he is still intubated   2.  Respiratory failure /pneumonia (aspiration versus CAP).  Intubated and ventilated. On IV antibiotics.  3.  Multiple subacute fractures on CT scan   HPI:      HPI: Andres Cruz is a 85 y.o. male was was brought to ED by EMS last night for evaluation of abdominal pain, vomiting .  She is intubated unable to provide history.  History comes from wife at bedside and medical record. Abdominal pain started a few weeks ago and it was associated with anorexia and significant weight loss.  A couple of days ago patient began vomiting, he vomited numerous times a day.  Emesis mainly green to brown liquid. He has been coughing at home, productive of white sputum. Finally last night patient started having shaking chills, told his wife he could not take it anymore and EMS was called.  CT scan just gastric outlet obstruction.  Per wife patient does not take NSAIDs, no history of peptic ulcer disease.  Pepcid is listed on home med list . Overall, no chronic GI complaints or problems.   ED course:  Temp 100.8, HR 100, BP 101/68, 02 sat 95%  K+ 2.9 CTAP and chest  >>> GOO with massive distention of the stomach and proximal duodenum.  August distended with fluid levels, bronchopneumonia or possibly aspiration, thoracic AAA measuring 4.3 cm, subacute fractures of the pubic rami and sacrum, multiple thoracic and L1 compression deformities   Past Medical History:  Diagnosis Date  . Other specified disorders of prostate Pancreatic insufficiency Hyperlipidemia     Past Surgical History:  Procedure Laterality Date   Hip surgery      Prior to Admission medications   Medication Sig Start Date End Date Taking? Authorizing Provider  atorvastatin (LIPITOR) 10 MG tablet Take 10 mg by mouth daily.   Yes [provider]  busPIRone (BUSPAR) 10 MG tablet Take 20 mg by mouth at bedtime.    Yes [provider]  busPIRone (BUSPAR) 5 MG tablet Take 10 mg by mouth daily.   Yes [provider]  calcium carbonate (OSCAL) 1500 (600 Ca) MG TABS tablet Take 1,200 mg of elemental calcium by mouth 2 (two) times daily with a meal.   Yes [provider]  Ergocalciferol (VITAMIN D2) 50 MCG (2000 UT) TABS Take 1 capsule by mouth daily.   Yes [provider]  famotidine (PEPCID) 10 MG tablet Take 10 mg by mouth 2 (two) times daily.   Yes [provider]  gabapentin (NEURONTIN) 600 MG tablet Take 600 mg by mouth 2 (two) times daily.  08/12/17  Yes [provider]  hydrochlorothiazide (HYDRODIURIL) 25 MG tablet Take 25 mg by mouth daily.   Yes [provider]  HYDROcodone-acetaminophen (NORCO/VICODIN) 5-325 MG tablet Take 1 tablet by mouth every 6 (six) hours as needed for moderate pain.  08/30/17  Yes [provider]  Pancrelipase, Lip-Prot-Amyl, 24000-76000 units CPEP Take 24,000 Units by mouth 3 (three) times daily.    Yes [provider]  senna-docusate (SENOKOT-S) 8.6-50 MG tablet Take 1 tablet by mouth daily.   Yes [provider]  tamsulosin (FLOMAX) 0.4 MG CAPS capsule Take 0.4 mg  by mouth daily.   Yes [provider]  triamcinolone cream (KENALOG) 0.1 % Apply 1 application topically 2 (two) times daily as needed for rash.   Yes [provider]  oxyCODONE (ROXICODONE) 5 MG immediate release tablet Take 1 tablet (5 mg total) by mouth every 4 (four) hours as needed for severe pain. Patient not taking: Reported on 09/07/2018 08/06/18   Gareth Morgan, MD    Current Facility-Administered Medications  Medication Dose Route Frequency Provider Last Rate Last Dose  . azithromycin (ZITHROMAX) 500 mg in sodium chloride 0.9 % 250 mL IVPB  500 mg Intravenous QHS Crista Luria, MD 250 mL/hr at 09/07/18 0558 500 mg at 09/07/18 0558  . cefTRIAXone (ROCEPHIN) 1 g in sodium chloride 0.9 % 100 mL IVPB  1 g Intravenous QHS Crista Luria, MD 200 mL/hr at 09/07/18 0759 1 g at 09/07/18 0759  . famotidine (PEPCID) IVPB 20 mg premix  20 mg Intravenous Q12H Crista Luria, MD   Stopped at 09/07/18 [phone]  . fentaNYL (SUBLIMAZE) injection 50 mcg  50 mcg Intravenous Q15 min PRN Crista Luria, MD   50 mcg at 09/07/18 0221  . fentaNYL (SUBLIMAZE) injection 50 mcg  50 mcg Intravenous Q2H PRN Crista Luria, MD      . lactated ringers infusion   Intravenous Continuous Crista Luria, MD 125 mL/hr at 09/07/18 [phone]    . ondansetron (ZOFRAN) injection 4 mg  4 mg Intravenous Q6H PRN Crista Luria, MD      . propofol (DIPRIVAN) 1000 MG/100ML infusion  0-50 mcg/kg/min Intravenous Continuous Crista Luria, MD 17.05 mL/hr at 09/07/18 0804 35 mcg/kg/min at 09/07/18 0804   Current Outpatient Medications  Medication Sig Dispense Refill  . atorvastatin (LIPITOR) 10 MG tablet Take 10 mg by mouth daily.    . busPIRone (BUSPAR) 10 MG tablet Take 20 mg by mouth at bedtime.     . busPIRone (BUSPAR) 5 MG tablet Take 10 mg by mouth daily.    . calcium carbonate (OSCAL) 1500 (600 Ca) MG TABS tablet Take 1,200 mg of elemental calcium by mouth 2 (two)  times daily with a meal.    . Ergocalciferol (VITAMIN D2) 50 MCG (2000 UT) TABS Take 1 capsule by mouth daily.    . famotidine (PEPCID) 10 MG tablet Take 10 mg by mouth 2 (two) times daily.    Marland Kitchen gabapentin (NEURONTIN) 600 MG tablet Take 600 mg by mouth 2 (two) times daily.     . hydrochlorothiazide (HYDRODIURIL) 25 MG tablet Take 25 mg by mouth daily.    Marland Kitchen HYDROcodone-acetaminophen (NORCO/VICODIN) 5-325 MG tablet Take 1 tablet by mouth every 6 (six) hours as needed for moderate pain.     . Pancrelipase, Lip-Prot-Amyl, 24000-76000 units CPEP Take 24,000 Units by mouth 3 (three) times daily.     Marland Kitchen senna-docusate (SENOKOT-S) 8.6-50 MG tablet Take 1 tablet by mouth daily.    . tamsulosin (  FLOMAX) 0.4 MG CAPS capsule Take 0.4 mg by mouth daily.    Marland Kitchen triamcinolone cream (KENALOG) 0.1 % Apply 1 application topically 2 (two) times daily as needed for rash.    . oxyCODONE (ROXICODONE) 5 MG immediate release tablet Take 1 tablet (5 mg total) by mouth every 4 (four) hours as needed for severe pain. (Patient not taking: Reported on 09/07/2018) 15 tablet 0    Allergies as of 08/18/2018  . (No Known Allergies)    No family history on file.  Social History   Socioeconomic History  . Marital status: Married    Spouse name: Not on file  . Number of children: Not on file  . Years of education: Not on file  . Highest education level: Not on file  Occupational History  . Not on file  Social Needs  . Financial resource strain: Not on file  . Food insecurity:    Worry: Not on file    Inability: Not on file  . Transportation needs:    Medical: Not on file    Non-medical: Not on file  Tobacco Use  . Smoking status: Former Smoker    Types: Cigarettes    Last attempt to quit: 08/06/1998    Years since quitting: 20.1  . Smokeless tobacco: Never Used  Substance and Sexual Activity  . Alcohol use: Not Currently  . Drug use: Never  . Sexual activity: Not on file  Lifestyle  . Physical activity:     Days per week: Not on file    Minutes per session: Not on file  . Stress: Not on file  Relationships  . Social connections:    Talks on phone: Not on file    Gets together: Not on file    Attends religious service: Not on file    Active member of club or organization: Not on file    Attends meetings of clubs or organizations: Not on file    Relationship status: Not on file  . Intimate partner violence:    Fear of current or ex partner: Not on file    Emotionally abused: Not on file    Physically abused: Not on file    Forced sexual activity: Not on file  Other Topics Concern  . Not on file  Social History Narrative  . Not on file    Review of Systems: All systems reviewed and negative except where noted in HPI.  Physical Exam: Vital signs in last 24 hours: Temp:  [97.7 F (36.5 C)-100.8 F (38.2 C)] 100.6 F (38.1 C) (02/01 0700) Pulse Rate:  [96-128] 99 (02/01 0700) Resp:  [11-47] 16 (02/01 0700) BP: (100-162)/(68-96) 101/68 (02/01 0700) SpO2:  [80 %-100 %] 95 % (02/01 0837) FiO2 (%):  [40 %-100 %] 40 % (02/01 0837) Weight:  [81.2 kg] 81.2 kg (02/01 0158)   General:   Thin, elderly male lying in bed intubated in NAD Psych:  Unable to assess. Eyes:  Pupils equal, sclera clear, no icterus.   Conjunctiva pink. Nose:  No deformity, discharge,  or lesions. Neck:  Supple; no masses. OGT in place Lungs:  Intubated / ventilated. Chest clear.  Heart:  Regular rate and rhythm no lower extremity edema Abdomen:  Soft, non-distended, nontender, BS active, no palp mass    Rectal:  Deferred  Msk:  Symmetrical without gross deformities. . Neurologic:  sedated Skin:  Intact without obvious significant lesions or rashes.   Intake/Output from previous day: 01/31 0701 - 02/01 0700 In: -  Out: 3000 [Emesis/NG output:1500] Intake/Output this shift: No intake/output data recorded.  Lab Results: Recent Labs    08/29/2018 2235 09/07/18 0500  WBC 15.8* 12.1*  HGB 13.3 13.0  HCT  42.0 39.9  PLT 272 216   BMET Recent Labs    08/08/2018 2235 09/07/18 0500  NA 138 137  K 3.7 2.9*  CL 96* 96*  CO2 27 26  GLUCOSE 187* 166*  BUN 24* 26*  CREATININE 1.16 1.13  CALCIUM 9.5 8.9   LFT Recent Labs    08/20/2018 2235  PROT 7.8  ALBUMIN 4.4  AST 29  ALT 12  ALKPHOS 186*  BILITOT 1.5*   PT/INR Recent Labs    08/23/2018 2235  LABPROT 12.6  INR 0.96   Hepatitis Panel No results for input(s): HEPBSAG, HCVAB, HEPAIGM, HEPBIGM in the last 72 hours.    Studies/Results: Ct Abdomen Pelvis Wo Contrast  Result Date: 09/07/2018 CLINICAL DATA:  85 y/o M; chest and abdominal pain. Abdominal distention. Dyspnea. Nausea and vomiting. EXAM: CT CHEST, ABDOMEN AND PELVIS WITHOUT CONTRAST TECHNIQUE: Multidetector CT imaging of the chest, abdomen and pelvis was performed following the standard protocol without IV contrast. COMPARISON:  None. FINDINGS: CT CHEST FINDINGS Cardiovascular: Normal heart size. No pericardial effusion. Coronary artery calcific atherosclerosis. Thoracic aorta measures up to 4.3 cm. Mediastinum/Nodes: Marked distention of the esophagus with fluid level likely due to downstream obstruction. Normal thyroid. No mediastinal or axillary adenopathy. Lungs/Pleura: Patchy areas of consolidation within the left upper lobe lingula and right lower lobe with clustered nodules compatible with bronchopneumonia. Findings may represent aspiration. No pleural effusion or pneumothorax. Musculoskeletal: T8, T9, T11, T12, L1 compression deformities, age indeterminate. T11 augmentation. L4-5 grade 1 anterolisthesis. CT ABDOMEN PELVIS FINDINGS Hepatobiliary: Pneumobilia within the left lobe of the liver. Otherwise no focal liver abnormality is seen. No gallstones, gallbladder wall thickening, or biliary dilatation. Pancreas: Unremarkable. No pancreatic ductal dilatation or surrounding inflammatory changes. Spleen: Normal in size without focal abnormality. Adrenals/Urinary Tract: Normal  adrenal glands. Multiple renal cysts bilaterally measuring up to 6.3 cm arising from the left kidney upper pole. No urinary stone disease or hydronephrosis. Bladder is unremarkable. Stomach/Bowel: Massive distention of the stomach and proximal duodenum to the level of the junction between second and third segments of the duodenum (series 4, image 94). Massive distention of the stomach elevates the left hemidiaphragm. No obstructive or inflammatory changes of the small and large bowel downstream to the duodenum. Sigmoid diverticulosis. Normal appendix. Vascular/Lymphatic: Aortic atherosclerosis. No enlarged abdominal or pelvic lymph nodes. Reproductive: Prostate is unremarkable. Other: Small left inguinal hernia containing fat. Musculoskeletal: Right total hip prosthesis without periprosthetic lucency or fracture identified, incompletely visualized. The acetabular screw traverses the medial cortex of the acetabulum extending into the iliopsoas muscle (series 4, image 104). Subacute fractures with callus formation involving the left superior and inferior pubic rami. Right-greater-than-left sacral fractures. IMPRESSION: 1. Massive distention of the stomach and proximal duodenum. Findings may represent a stricture or obstructive mass of the duodenum. The distended stomach elevates the left hemidiaphragm and displaces rightward the mediastinum. 2. Distention of esophagus with fluid levels likely due to downstream duodenal obstruction. 3. Left upper lobe lingula and right lower lobe consolidation may reflect bronchopneumonia or possibly aspiration. 4. Thoracic aortic aneurysm measuring up to 4.3 cm. Recommend annual imaging followup by CTA or MRA. This recommendation follows 2010 ACCF/AHA/AATS/ACR/ASA/SCA/SCAI/SIR/STS/SVM Guidelines for the Diagnosis and Management of Patients with Thoracic Aortic Disease. 2010; 121: O536-U440. 5. Coronary artery and aortic calcific 6. Subacute  fractures of left superior and inferior  pubic rami as well as right-greater-than-left sacrum. 7. T8, T9, T11, T12, L1 compression deformities, age indeterminate. Electronically Signed   By: Kristine Garbe M.D.   On: 09/07/2018 00:39   Dg Chest 2 View  Result Date: 09/04/2018 CLINICAL DATA:  Shortness of breath EXAM: CHEST - 2 VIEW COMPARISON:  None. FINDINGS: Marked elevation of the left hemidiaphragm and gaseous distention of the stomach beneath the left hemidiaphragm. Compressive atelectasis in the left lower lobe. Low volumes on the right without confluent airspace opacity. Heart appears to be normal size. No visible effusions or pneumothorax. No acute bony abnormality. IMPRESSION: Marked gaseous distention of the stomach and elevation of the left hemidiaphragm. Compressive atelectasis in the left lower lobe. Very low lung volumes. Electronically Signed   By: Rolm Baptise M.D.   On: 08/22/2018 23:11   Dg Abdomen 1 View  Result Date: 09/07/2018 CLINICAL DATA:  Orogastric tube placement EXAM: ABDOMEN - 1 VIEW COMPARISON:  CT 08/15/2018 FINDINGS: The tip and side port of a gastric tube are noted in the left upper quadrant in the expected location of the distended stomach. Stool is seen within nonobstructed, nondistended large bowel. Atelectasis is seen at the lung bases. Partially included right hip arthroplasty. IMPRESSION: Gastric tube with tip and side-port are seen in the expected location of the stomach. Electronically Signed   By: Ashley Royalty M.D.   On: 09/07/2018 02:20   Ct Chest Wo Contrast  Result Date: 09/07/2018 CLINICAL DATA:  85 y/o M; chest and abdominal pain. Abdominal distention. Dyspnea. Nausea and vomiting. EXAM: CT CHEST, ABDOMEN AND PELVIS WITHOUT CONTRAST TECHNIQUE: Multidetector CT imaging of the chest, abdomen and pelvis was performed following the standard protocol without IV contrast. COMPARISON:  None. FINDINGS: CT CHEST FINDINGS Cardiovascular: Normal heart size. No pericardial effusion. Coronary artery  calcific atherosclerosis. Thoracic aorta measures up to 4.3 cm. Mediastinum/Nodes: Marked distention of the esophagus with fluid level likely due to downstream obstruction. Normal thyroid. No mediastinal or axillary adenopathy. Lungs/Pleura: Patchy areas of consolidation within the left upper lobe lingula and right lower lobe with clustered nodules compatible with bronchopneumonia. Findings may represent aspiration. No pleural effusion or pneumothorax. Musculoskeletal: T8, T9, T11, T12, L1 compression deformities, age indeterminate. T11 augmentation. L4-5 grade 1 anterolisthesis. CT ABDOMEN PELVIS FINDINGS Hepatobiliary: Pneumobilia within the left lobe of the liver. Otherwise no focal liver abnormality is seen. No gallstones, gallbladder wall thickening, or biliary dilatation. Pancreas: Unremarkable. No pancreatic ductal dilatation or surrounding inflammatory changes. Spleen: Normal in size without focal abnormality. Adrenals/Urinary Tract: Normal adrenal glands. Multiple renal cysts bilaterally measuring up to 6.3 cm arising from the left kidney upper pole. No urinary stone disease or hydronephrosis. Bladder is unremarkable. Stomach/Bowel: Massive distention of the stomach and proximal duodenum to the level of the junction between second and third segments of the duodenum (series 4, image 94). Massive distention of the stomach elevates the left hemidiaphragm. No obstructive or inflammatory changes of the small and large bowel downstream to the duodenum. Sigmoid diverticulosis. Normal appendix. Vascular/Lymphatic: Aortic atherosclerosis. No enlarged abdominal or pelvic lymph nodes. Reproductive: Prostate is unremarkable. Other: Small left inguinal hernia containing fat. Musculoskeletal: Right total hip prosthesis without periprosthetic lucency or fracture identified, incompletely visualized. The acetabular screw traverses the medial cortex of the acetabulum extending into the iliopsoas muscle (series 4, image 104).  Subacute fractures with callus formation involving the left superior and inferior pubic rami. Right-greater-than-left sacral fractures. IMPRESSION: 1. Massive distention of the  stomach and proximal duodenum. Findings may represent a stricture or obstructive mass of the duodenum. The distended stomach elevates the left hemidiaphragm and displaces rightward the mediastinum. 2. Distention of esophagus with fluid levels likely due to downstream duodenal obstruction. 3. Left upper lobe lingula and right lower lobe consolidation may reflect bronchopneumonia or possibly aspiration. 4. Thoracic aortic aneurysm measuring up to 4.3 cm. Recommend annual imaging followup by CTA or MRA. This recommendation follows 2010 ACCF/AHA/AATS/ACR/ASA/SCA/SCAI/SIR/STS/SVM Guidelines for the Diagnosis and Management of Patients with Thoracic Aortic Disease. 2010; 121: [id number]. 5. Coronary artery and aortic calcific 6. Subacute fractures of left superior and inferior pubic rami as well as right-greater-than-left sacrum. 7. T8, T9, T11, T12, L1 compression deformities, age indeterminate. Electronically Signed   By: Kristine Garbe M.D.   On: 09/07/2018 00:39   Dg Chest Port 1 View  Result Date: 09/07/2018 CLINICAL DATA:  Post intubation EXAM: PORTABLE CHEST 1 VIEW COMPARISON:  08/12/2018 FINDINGS: Endotracheal tube is been placed with tip measuring 5.4 cm above the carina. Enteric tube is been placed with tip over the left lower chest consistent with location in the stomach which on the previous study was demonstrated below and elevated left hemidiaphragm. The stomach has decompressed since the previous study. There is a shallow inspiration. Atelectasis or infiltration in both lung bases with probable small pleural effusions. Heart size is normal. No pneumothorax. Degenerative changes in the shoulders. IMPRESSION: Appliances appear in satisfactory location. Shallow inspiration with atelectasis or infiltration in the lung bases  and small bilateral pleural effusions. Electronically Signed   By: Lucienne Capers M.D.   On: 09/07/2018 02:16     Tye Savoy, NP-C @  09/07/2018, 8:48 AM

## 2018-09-07 NOTE — ED Notes (Signed)
ED TO INPATIENT HANDOFF REPORT  Name/Age/Gender Andres Cruz 85 y.o. male  Code Status    Code Status Orders  (From admission, onward)         Start     Ordered   09/07/18 0300  Full code  Continuous     09/07/18 0303        Code Status History    This patient has a current code status but no historical code status.      Home/SNF/Other Home  Chief Complaint N/V/D, SOB  Level of Care/Admitting Diagnosis ED Disposition    ED Disposition Condition Comment   Admit  Hospital Area: [facility] [[id number]]  Level of Care: ICU [6]  Diagnosis: Acute respiratory failure ([place]) [518.81.ICD-9-CM]  Admitting Physician: Crista Luria [phone]  Attending Physician: Crista Luria [phone]  Estimated length of stay: 5 - 7 days  Certification:: I certify this patient will need inpatient services for at least 2 midnights  PT Class (Do Not Modify): Inpatient [101]  PT Acc Code (Do Not Modify): Private [1]       Medical History Past Medical History:  Diagnosis Date  . Other specified disorders of prostate     Allergies No Known Allergies  IV Location/Drains/Wounds Patient Lines/Drains/Airways Status   Active Line/Drains/Airways    Name:   Placement date:   Placement time:   Site:   Days:   Peripheral IV 08/18/2018 Left Hand   09/03/2018    2256    Hand   1   Peripheral IV 08/30/2018 Right Forearm   08/11/2018    2256    Forearm   1   Peripheral IV 08/22/2018 Left Antecubital   08/21/2018    2257    Antecubital   1   Peripheral IV 09/07/18 Right Hand   09/07/18    0555    Hand   less than 1   NG/OG Tube Orogastric 18 Fr. Center mouth Aucultation;Xray   09/07/18    0142    Center mouth   less than 1   Urethral Catheter Joy Ingram/EMT Latex;Temperature probe;Straight-tip 14 Fr.   09/07/18    6468    Latex;Temperature probe;Straight-tip   less than 1   Airway 7.5 mm   09/07/18    0126     less than 1          Labs/Imaging Results for orders placed or  performed during the hospital encounter of 08/10/2018 (from the past 48 hour(s))  Comprehensive metabolic panel     Status: Abnormal   Collection Time: 08/12/2018 10:35 PM  Result Value Ref Range   Sodium 138 135 - 145 mmol/L   Potassium 3.7 3.5 - 5.1 mmol/L   Chloride 96 (L) 98 - 111 mmol/L   CO2 27 22 - 32 mmol/L   Glucose, Bld 187 (H) 70 - 99 mg/dL   BUN 24 (H) 8 - 23 mg/dL   Creatinine, Ser 1.16 0.61 - 1.24 mg/dL   Calcium 9.5 8.9 - 10.3 mg/dL   Total Protein 7.8 6.5 - 8.1 g/dL   Albumin 4.4 3.5 - 5.0 g/dL   AST 29 15 - 41 U/L   ALT 12 0 - 44 U/L   Alkaline Phosphatase 186 (H) 38 - 126 U/L   Total Bilirubin 1.5 (H) 0.3 - 1.2 mg/dL   GFR calc non Af Amer 56 (L) >60 mL/min   GFR calc Af Amer >60 >60 mL/min   Anion gap 15 5 - 15  Comment: Performed at [facility], [place] [address], [place], [place] [address]  Lactic acid, plasma     Status: Abnormal   Collection Time: 08/28/2018 10:35 PM  Result Value Ref Range   Lactic Acid, Venous 2.9 (HH) 0.5 - 1.9 mmol/L    Comment: CRITICAL RESULT CALLED TO, READ BACK BY AND VERIFIED WITH: DOSTER,T RN @2340  ON 08/29/2018 JACKSON,K Performed at [facility], [place] [address], [place], [place] [address]   CBC with Differential     Status: Abnormal   Collection Time: 08/24/2018 10:35 PM  Result Value Ref Range   WBC 15.8 (H) 4.0 - 10.5 K/uL   RBC 4.51 4.22 - 5.81 MIL/uL   Hemoglobin 13.3 13.0 - 17.0 g/dL   HCT 42.0 39.0 - 52.0 %   MCV 93.1 80.0 - 100.0 fL   MCH 29.5 26.0 - 34.0 pg   MCHC 31.7 30.0 - 36.0 g/dL   RDW 14.1 11.5 - 15.5 %   Platelets 272 150 - 400 K/uL   nRBC 0.0 0.0 - 0.2 %   Neutrophils Relative % 82 %   Neutro Abs 12.9 (H) 1.7 - 7.7 K/uL   Lymphocytes Relative 13 %   Lymphs Abs 2.1 0.7 - 4.0 K/uL   Monocytes Relative 4 %   Monocytes Absolute 0.6 0.1 - 1.0 K/uL   Eosinophils Relative 0 %   Eosinophils Absolute 0.0 0.0 - 0.5 K/uL   Basophils Relative 0 %   Basophils Absolute 0.0 0.0 - 0.1  K/uL   Immature Granulocytes 1 %   Abs Immature Granulocytes 0.14 (H) 0.00 - 0.07 K/uL    Comment: Performed at [facility], [place] [address], [place], [place] [address]  Protime-INR     Status: None   Collection Time: 09/04/2018 10:35 PM  Result Value Ref Range   Prothrombin Time 12.6 11.4 - 15.2 seconds   INR 0.96     Comment: Performed at [facility], [place] [address], [place], [place] [address]  Culture, blood (Routine x 2)     Status: None (Preliminary result)   Collection Time: 08/26/2018 10:35 PM  Result Value Ref Range   Specimen Description BLOOD LEFT FOREARM    Special Requests      BOTTLES DRAWN AEROBIC AND ANAEROBIC Blood Culture adequate volume Performed at [place] [address], [place], [place] [address]    Culture NO GROWTH < 12 HOURS    Report Status PENDING   Culture, blood (Routine x 2)     Status: None (Preliminary result)   Collection Time: 09/01/2018 10:35 PM  Result Value Ref Range   Specimen Description BLOOD RIGHT FOREARM    Special Requests      BOTTLES DRAWN AEROBIC AND ANAEROBIC Blood Culture adequate volume Performed at [place] [address] [place], [place] [address]    Culture NO GROWTH < 12 HOURS    Report Status PENDING   Influenza panel by PCR (type A & B)     Status: None   Collection Time: 09/07/18  1:42 AM  Result Value Ref Range   Influenza A By PCR NEGATIVE NEGATIVE   Influenza B By PCR NEGATIVE NEGATIVE    Comment: (NOTE) The Xpert Xpress Flu assay is intended as an aid in the diagnosis of  influenza and should not be used as a sole basis for treatment.  This  assay is FDA approved for nasopharyngeal swab specimens only. Nasal  washings  and aspirates are unacceptable for Xpert Xpress Flu testing. Performed at [facility], [place] [address] [place], [place] [address]   Triglycerides     Status: None   Collection Time: 09/07/18  1:53 AM   Result Value Ref Range   Triglycerides 65 <150 mg/dL    Comment: Performed at [facility], [place] [address], [place], [place] [address]  Urinalysis, Routine w reflex microscopic     Status: Abnormal   Collection Time: 09/07/18  2:10 AM  Result Value Ref Range   Color, Urine AMBER (A) YELLOW    Comment: BIOCHEMICALS MAY BE AFFECTED BY COLOR   APPearance HAZY (A) CLEAR   Specific Gravity, Urine 1.030 1.005 - 1.030   pH 5.0 5.0 - 8.0   Glucose, UA NEGATIVE NEGATIVE mg/dL   Hgb urine dipstick NEGATIVE NEGATIVE   Bilirubin Urine NEGATIVE NEGATIVE   Ketones, ur 5 (A) NEGATIVE mg/dL   Protein, ur 30 (A) NEGATIVE mg/dL   Nitrite NEGATIVE NEGATIVE   Leukocytes, UA NEGATIVE NEGATIVE   RBC / HPF 0-5 0 - 5 RBC/hpf   WBC, UA 6-10 0 - 5 WBC/hpf   Bacteria, UA RARE (A) NONE SEEN   Squamous Epithelial / LPF 0-5 0 - 5   Mucus PRESENT    Hyaline Casts, UA PRESENT     Comment: Performed at [facility], [place] [address] [place], [place] [address]  Blood gas, arterial     Status: Abnormal   Collection Time: 09/07/18  2:30 AM  Result Value Ref Range   FIO2 60.00    Delivery systems VENTILATOR    Mode PRESSURE REGULATED VOLUME CONTROL    VT 0.620 mL   LHR 16 resp/min   Peep/cpap 5.0 cm H20   pH, Arterial 7.401 7.350 - 7.450   pCO2 arterial 45.4 32.0 - 48.0 mmHg   pO2, Arterial 141 (H) 83.0 - 108.0 mmHg   Bicarbonate 27.6 20.0 - 28.0 mmol/L   Acid-Base Excess 2.8 (H) 0.0 - 2.0 mmol/L   O2 Saturation 98.7 %   Patient temperature 98.6    Collection site RIGHT RADIAL    Drawn by [id number]    Sample type ARTERIAL DRAW    Allens test (pass/fail) PASS PASS    Comment: Performed at [facility], [place] [address] [place], [place] [address]  Lactic acid, plasma     Status: Abnormal   Collection Time: 09/07/18  2:33 AM  Result Value Ref Range   Lactic Acid, Venous 3.2 (HH) 0.5 - 1.9 mmol/L    Comment: CRITICAL RESULT CALLED TO, READ BACK BY AND  VERIFIED WITH: DOSTER,T RN @0420  ON 09/07/2018 JACKSON,K Performed at [facility], [place] [address], [place], [place] [address]   CBC     Status: Abnormal   Collection Time: 09/07/18  5:00 AM  Result Value Ref Range   WBC 12.1 (H) 4.0 - 10.5 K/uL   RBC 4.24 4.22 - 5.81 MIL/uL   Hemoglobin 13.0 13.0 - 17.0 g/dL   HCT 39.9 39.0 - 52.0 %   MCV 94.1 80.0 - 100.0 fL   MCH 30.7 26.0 - 34.0 pg   MCHC 32.6 30.0 - 36.0 g/dL   RDW 14.2 11.5 - 15.5 %   Platelets 216 150 - 400 K/uL   nRBC 0.0 0.0 - 0.2 %    Comment: Performed at [facility], [place] [address], [place], [place] [address]  Basic metabolic panel  Status: Abnormal   Collection Time: 09/07/18  5:00 AM  Result Value Ref Range   Sodium 137 135 - 145 mmol/L   Potassium 2.9 (L) 3.5 - 5.1 mmol/L    Comment: DELTA CHECK NOTED   Chloride 96 (L) 98 - 111 mmol/L   CO2 26 22 - 32 mmol/L   Glucose, Bld 166 (H) 70 - 99 mg/dL   BUN 26 (H) 8 - 23 mg/dL   Creatinine, Ser 1.13 0.61 - 1.24 mg/dL   Calcium 8.9 8.9 - 10.3 mg/dL   GFR calc non Af Amer 58 (L) >60 mL/min   GFR calc Af Amer >60 >60 mL/min   Anion gap 15 5 - 15    Comment: Performed at [facility], [place] [address], [place], [place] [address]  Magnesium     Status: None   Collection Time: 09/07/18  5:00 AM  Result Value Ref Range   Magnesium 1.8 1.7 - 2.4 mg/dL    Comment: Performed at [facility], [place] [address] [place], [place] [address]  Phosphorus     Status: None   Collection Time: 09/07/18  5:00 AM  Result Value Ref Range   Phosphorus 3.5 2.5 - 4.6 mg/dL    Comment: Performed at [facility], [place] [address], [place], [place] [address]  Prealbumin     Status: Abnormal   Collection Time: 09/07/18  8:00 AM  Result Value Ref Range   Prealbumin 17.7 (L) 18 - 38 mg/dL    Comment: Performed at [facility], [place] [address], [place], [place] [address]   Ct Abdomen  Pelvis Wo Contrast  Result Date: 09/07/2018 CLINICAL DATA:  85 y/o M; chest and abdominal pain. Abdominal distention. Dyspnea. Nausea and vomiting. EXAM: CT CHEST, ABDOMEN AND PELVIS WITHOUT CONTRAST TECHNIQUE: Multidetector CT imaging of the chest, abdomen and pelvis was performed following the standard protocol without IV contrast. COMPARISON:  None. FINDINGS: CT CHEST FINDINGS Cardiovascular: Normal heart size. No pericardial effusion. Coronary artery calcific atherosclerosis. Thoracic aorta measures up to 4.3 cm. Mediastinum/Nodes: Marked distention of the esophagus with fluid level likely due to downstream obstruction. Normal thyroid. No mediastinal or axillary adenopathy. Lungs/Pleura: Patchy areas of consolidation within the left upper lobe lingula and right lower lobe with clustered nodules compatible with bronchopneumonia. Findings may represent aspiration. No pleural effusion or pneumothorax. Musculoskeletal: T8, T9, T11, T12, L1 compression deformities, age indeterminate. T11 augmentation. L4-5 grade 1 anterolisthesis. CT ABDOMEN PELVIS FINDINGS Hepatobiliary: Pneumobilia within the left lobe of the liver. Otherwise no focal liver abnormality is seen. No gallstones, gallbladder wall thickening, or biliary dilatation. Pancreas: Unremarkable. No pancreatic ductal dilatation or surrounding inflammatory changes. Spleen: Normal in size without focal abnormality. Adrenals/Urinary Tract: Normal adrenal glands. Multiple renal cysts bilaterally measuring up to 6.3 cm arising from the left kidney upper pole. No urinary stone disease or hydronephrosis. Bladder is unremarkable. Stomach/Bowel: Massive distention of the stomach and proximal duodenum to the level of the junction between second and third segments of the duodenum (series 4, image 94). Massive distention of the stomach elevates the left hemidiaphragm. No obstructive or inflammatory changes of the small and large bowel downstream to the duodenum. Sigmoid  diverticulosis. Normal appendix. Vascular/Lymphatic: Aortic atherosclerosis. No enlarged abdominal or pelvic lymph nodes. Reproductive: Prostate is unremarkable. Other: Small left inguinal hernia containing fat. Musculoskeletal: Right total hip prosthesis without periprosthetic lucency or fracture identified, incompletely visualized. The acetabular screw traverses the medial cortex of the acetabulum extending into  the iliopsoas muscle (series 4, image 104). Subacute fractures with callus formation involving the left superior and inferior pubic rami. Right-greater-than-left sacral fractures. IMPRESSION: 1. Massive distention of the stomach and proximal duodenum. Findings may represent a stricture or obstructive mass of the duodenum. The distended stomach elevates the left hemidiaphragm and displaces rightward the mediastinum. 2. Distention of esophagus with fluid levels likely due to downstream duodenal obstruction. 3. Left upper lobe lingula and right lower lobe consolidation may reflect bronchopneumonia or possibly aspiration. 4. Thoracic aortic aneurysm measuring up to 4.3 cm. Recommend annual imaging followup by CTA or MRA. This recommendation follows 2010 ACCF/AHA/AATS/ACR/ASA/SCA/SCAI/SIR/STS/SVM Guidelines for the Diagnosis and Management of Patients with Thoracic Aortic Disease. 2010; 121: E751-Z001. 5. Coronary artery and aortic calcific 6. Subacute fractures of left superior and inferior pubic rami as well as right-greater-than-left sacrum. 7. T8, T9, T11, T12, L1 compression deformities, age indeterminate. Electronically Signed   By: Kristine Garbe M.D.   On: 09/07/2018 00:39   Dg Chest 2 View  Result Date: 08/26/2018 CLINICAL DATA:  Shortness of breath EXAM: CHEST - 2 VIEW COMPARISON:  None. FINDINGS: Marked elevation of the left hemidiaphragm and gaseous distention of the stomach beneath the left hemidiaphragm. Compressive atelectasis in the left lower lobe. Low volumes on the right without  confluent airspace opacity. Heart appears to be normal size. No visible effusions or pneumothorax. No acute bony abnormality. IMPRESSION: Marked gaseous distention of the stomach and elevation of the left hemidiaphragm. Compressive atelectasis in the left lower lobe. Very low lung volumes. Electronically Signed   By: Rolm Baptise M.D.   On: 08/19/2018 23:11   Dg Abdomen 1 View  Result Date: 09/07/2018 CLINICAL DATA:  Orogastric tube placement EXAM: ABDOMEN - 1 VIEW COMPARISON:  CT 09/02/2018 FINDINGS: The tip and side port of a gastric tube are noted in the left upper quadrant in the expected location of the distended stomach. Stool is seen within nonobstructed, nondistended large bowel. Atelectasis is seen at the lung bases. Partially included right hip arthroplasty. IMPRESSION: Gastric tube with tip and side-port are seen in the expected location of the stomach. Electronically Signed   By: Ashley Royalty M.D.   On: 09/07/2018 02:20   Ct Chest Wo Contrast  Result Date: 09/07/2018 CLINICAL DATA:  85 y/o M; chest and abdominal pain. Abdominal distention. Dyspnea. Nausea and vomiting. EXAM: CT CHEST, ABDOMEN AND PELVIS WITHOUT CONTRAST TECHNIQUE: Multidetector CT imaging of the chest, abdomen and pelvis was performed following the standard protocol without IV contrast. COMPARISON:  None. FINDINGS: CT CHEST FINDINGS Cardiovascular: Normal heart size. No pericardial effusion. Coronary artery calcific atherosclerosis. Thoracic aorta measures up to 4.3 cm. Mediastinum/Nodes: Marked distention of the esophagus with fluid level likely due to downstream obstruction. Normal thyroid. No mediastinal or axillary adenopathy. Lungs/Pleura: Patchy areas of consolidation within the left upper lobe lingula and right lower lobe with clustered nodules compatible with bronchopneumonia. Findings may represent aspiration. No pleural effusion or pneumothorax. Musculoskeletal: T8, T9, T11, T12, L1 compression deformities, age  indeterminate. T11 augmentation. L4-5 grade 1 anterolisthesis. CT ABDOMEN PELVIS FINDINGS Hepatobiliary: Pneumobilia within the left lobe of the liver. Otherwise no focal liver abnormality is seen. No gallstones, gallbladder wall thickening, or biliary dilatation. Pancreas: Unremarkable. No pancreatic ductal dilatation or surrounding inflammatory changes. Spleen: Normal in size without focal abnormality. Adrenals/Urinary Tract: Normal adrenal glands. Multiple renal cysts bilaterally measuring up to 6.3 cm arising from the left kidney upper pole. No urinary stone disease or hydronephrosis. Bladder is unremarkable. Stomach/Bowel:  Massive distention of the stomach and proximal duodenum to the level of the junction between second and third segments of the duodenum (series 4, image 94). Massive distention of the stomach elevates the left hemidiaphragm. No obstructive or inflammatory changes of the small and large bowel downstream to the duodenum. Sigmoid diverticulosis. Normal appendix. Vascular/Lymphatic: Aortic atherosclerosis. No enlarged abdominal or pelvic lymph nodes. Reproductive: Prostate is unremarkable. Other: Small left inguinal hernia containing fat. Musculoskeletal: Right total hip prosthesis without periprosthetic lucency or fracture identified, incompletely visualized. The acetabular screw traverses the medial cortex of the acetabulum extending into the iliopsoas muscle (series 4, image 104). Subacute fractures with callus formation involving the left superior and inferior pubic rami. Right-greater-than-left sacral fractures. IMPRESSION: 1. Massive distention of the stomach and proximal duodenum. Findings may represent a stricture or obstructive mass of the duodenum. The distended stomach elevates the left hemidiaphragm and displaces rightward the mediastinum. 2. Distention of esophagus with fluid levels likely due to downstream duodenal obstruction. 3. Left upper lobe lingula and right lower lobe  consolidation may reflect bronchopneumonia or possibly aspiration. 4. Thoracic aortic aneurysm measuring up to 4.3 cm. Recommend annual imaging followup by CTA or MRA. This recommendation follows 2010 ACCF/AHA/AATS/ACR/ASA/SCA/SCAI/SIR/STS/SVM Guidelines for the Diagnosis and Management of Patients with Thoracic Aortic Disease. 2010; 121: X735-H299. 5. Coronary artery and aortic calcific 6. Subacute fractures of left superior and inferior pubic rami as well as right-greater-than-left sacrum. 7. T8, T9, T11, T12, L1 compression deformities, age indeterminate. Electronically Signed   By: Kristine Garbe M.D.   On: 09/07/2018 00:39   Dg Chest Port 1 View  Result Date: 09/07/2018 CLINICAL DATA:  Post intubation EXAM: PORTABLE CHEST 1 VIEW COMPARISON:  08/07/2018 FINDINGS: Endotracheal tube is been placed with tip measuring 5.4 cm above the carina. Enteric tube is been placed with tip over the left lower chest consistent with location in the stomach which on the previous study was demonstrated below and elevated left hemidiaphragm. The stomach has decompressed since the previous study. There is a shallow inspiration. Atelectasis or infiltration in both lung bases with probable small pleural effusions. Heart size is normal. No pneumothorax. Degenerative changes in the shoulders. IMPRESSION: Appliances appear in satisfactory location. Shallow inspiration with atelectasis or infiltration in the lung bases and small bilateral pleural effusions. Electronically Signed   By: Lucienne Capers M.D.   On: 09/07/2018 02:16   EKG Interpretation  Date/Time:  Friday September 06 2018 22:25:47 EST Ventricular Rate:  115 PR Interval:    QRS Duration: 150 QT Interval:  334 QTC Calculation: 462 R Axis:   -26 Text Interpretation:  Sinus tachycardia Multiform ventricular premature complexes Right bundle branch block Abnormal T, consider ischemia, lateral leads NO STEMI No old tracing to compare Confirmed by Addison Lank [phone]) on 09/01/2018 11:22:20 PM Also confirmed by Addison Lank [phone]), editor Philomena Doheny [phone])  on 09/07/2018 10:29:04 AM   Pending Labs Unresulted Labs (From admission, onward)    Start     Ordered   09/16/18 0000  Prealbumin  Weekly,   R     09/07/18 0721   09/07/18 0500  CBC  Daily,   R     09/07/18 0303   09/07/18 9892  Basic metabolic panel  Daily,   R     09/07/18 0303   09/07/18 0500  Magnesium  Daily,   R     09/07/18 0303   09/07/18 0334  Culture, respiratory (non-expectorated)  Once,   R  09/07/18 0333   09/07/18 0153  Triglycerides  (propofol (DIPRIVAN))  Every 72 hours,   R    Comments:  While on propofol (DIPRIVAN)    09/07/18 0152          Vitals/Pain Today's Vitals   09/07/18 0926 09/07/18 0930 09/07/18 1000 09/07/18 1030  BP: 99/69 96/64 101/67 100/63  Pulse: 96 95 92 92  Resp: 16 16 16 16   Temp:  (!) 101.1 F (38.4 C) (!) 101.3 F (38.5 C) (!) 101.3 F (38.5 C)  TempSrc:      SpO2: 96% 98% 98% 99%  Weight:      Height:      PainSc:        Isolation Precautions Droplet precaution  Medications Medications  fentaNYL (SUBLIMAZE) injection 50 mcg (50 mcg Intravenous Given 09/07/18 0221)  fentaNYL (SUBLIMAZE) injection 50 mcg (has no administration in time range)  propofol (DIPRIVAN) 1000 MG/100ML infusion (35 mcg/kg/min  81.2 kg Intravenous New Bag/Given 09/07/18 0804)  famotidine (PEPCID) IVPB 20 mg premix (20 mg Intravenous New Bag/Given 09/07/18 1021)  ondansetron (ZOFRAN) injection 4 mg (has no administration in time range)  lactated ringers infusion ( Intravenous New Bag/Given 09/07/18 0443)  cefTRIAXone (ROCEPHIN) 1 g in sodium chloride 0.9 % 100 mL IVPB (1 g Intravenous New Bag/Given 09/07/18 0759)  azithromycin (ZITHROMAX) 500 mg in sodium chloride 0.9 % 250 mL IVPB (500 mg Intravenous New Bag/Given 09/07/18 0558)  albuterol (PROVENTIL) (2.5 MG/3ML) 0.083% nebulizer solution 5 mg (5 mg Nebulization Given 09/07/18 0020)  sodium chloride  flush (NS) 0.9 % injection 3 mL (3 mLs Intravenous Given 09/07/18 0800)  ondansetron (ZOFRAN) injection 4 mg (4 mg Intravenous Given 08/22/2018 2356)  sodium chloride 0.9 % bolus 1,000 mL (0 mLs Intravenous Stopped 09/07/18 0301)  lidocaine (XYLOCAINE) 2 % jelly 1 application (1 application Other Given 09/07/18 0217)    Mobility walks

## 2018-09-07 NOTE — Progress Notes (Signed)
Pt has skin tear on left buttock, also unstageable pressure injury on sacrum. Foam placed on sacrum. Pt has small healed abrashions on toes. Foam heel protectors placed on pts heels due to blanchable redness.

## 2018-09-07 NOTE — Consult Note (Signed)
Reason for Consult:vomiting Referring Physician: Dr. Kandis Nab Gough is an 85 y.o. male.  HPI: The patient is an 85 year old black male who presents with vomiting for the last 2 days. He is now intubated and sedated. Wife says he has been complaining of abd pain since he fell in December. He has lost about 15 lbs since then. He has been taking hydrocodone for compression fractures in his back. He likely aspirated and needed to be intubated. Ng now has stomach decompressed. CT suggestive of GOO  Past Medical History:  Diagnosis Date  . Other specified disorders of prostate     Past Surgical History:  Procedure Laterality Date  . HIP SURGERY      No family history on file.  Social History:  reports that he quit smoking about 20 years ago. His smoking use included cigarettes. He has never used smokeless tobacco. He reports previous alcohol use. He reports that he does not use drugs.  Allergies: No Known Allergies  Medications: I have reviewed the patient's current medications.  Results for orders placed or performed during the hospital encounter of 09/04/2018 (from the past 48 hour(s))  Comprehensive metabolic panel     Status: Abnormal   Collection Time: 08/13/2018 10:35 PM  Result Value Ref Range   Sodium 138 135 - 145 mmol/L   Potassium 3.7 3.5 - 5.1 mmol/L   Chloride 96 (L) 98 - 111 mmol/L   CO2 27 22 - 32 mmol/L   Glucose, Bld 187 (H) 70 - 99 mg/dL   BUN 24 (H) 8 - 23 mg/dL   Creatinine, Ser 1.16 0.61 - 1.24 mg/dL   Calcium 9.5 8.9 - 10.3 mg/dL   Total Protein 7.8 6.5 - 8.1 g/dL   Albumin 4.4 3.5 - 5.0 g/dL   AST 29 15 - 41 U/L   ALT 12 0 - 44 U/L   Alkaline Phosphatase 186 (H) 38 - 126 U/L   Total Bilirubin 1.5 (H) 0.3 - 1.2 mg/dL   GFR calc non Af Amer 56 (L) >60 mL/min   GFR calc Af Amer >60 >60 mL/min   Anion gap 15 5 - 15    Comment: Performed at [facility], [place] [address] [place], [place] [address]  Lactic acid, plasma     Status: Abnormal    Collection Time: 08/15/2018 10:35 PM  Result Value Ref Range   Lactic Acid, Venous 2.9 (HH) 0.5 - 1.9 mmol/L    Comment: CRITICAL RESULT CALLED TO, READ BACK BY AND VERIFIED WITH: DOSTER,T RN @2340  ON 08/07/2018 JACKSON,K Performed at [facility], [place] [address] [place], [place] [address]   CBC with Differential     Status: Abnormal   Collection Time: 08/25/2018 10:35 PM  Result Value Ref Range   WBC 15.8 (H) 4.0 - 10.5 K/uL   RBC 4.51 4.22 - 5.81 MIL/uL   Hemoglobin 13.3 13.0 - 17.0 g/dL   HCT 42.0 39.0 - 52.0 %   MCV 93.1 80.0 - 100.0 fL   MCH 29.5 26.0 - 34.0 pg   MCHC 31.7 30.0 - 36.0 g/dL   RDW 14.1 11.5 - 15.5 %   Platelets 272 150 - 400 K/uL   nRBC 0.0 0.0 - 0.2 %   Neutrophils Relative % 82 %   Neutro Abs 12.9 (H) 1.7 - 7.7 K/uL   Lymphocytes Relative 13 %   Lymphs Abs 2.1 0.7 - 4.0 K/uL   Monocytes Relative 4 %   Monocytes  Absolute 0.6 0.1 - 1.0 K/uL   Eosinophils Relative 0 %   Eosinophils Absolute 0.0 0.0 - 0.5 K/uL   Basophils Relative 0 %   Basophils Absolute 0.0 0.0 - 0.1 K/uL   Immature Granulocytes 1 %   Abs Immature Granulocytes 0.14 (H) 0.00 - 0.07 K/uL    Comment: Performed at [facility], [place] [address] [place], [place] [address]  Protime-INR     Status: None   Collection Time: 08/12/2018 10:35 PM  Result Value Ref Range   Prothrombin Time 12.6 11.4 - 15.2 seconds   INR 0.96     Comment: Performed at [facility], [place] [address] [place], [place] [address]  Influenza panel by PCR (type A & B)     Status: None   Collection Time: 09/07/18  1:42 AM  Result Value Ref Range   Influenza A By PCR NEGATIVE NEGATIVE   Influenza B By PCR NEGATIVE NEGATIVE    Comment: (NOTE) The Xpert Xpress Flu assay is intended as an aid in the diagnosis of  influenza and should not be used as a sole basis for treatment.  This  assay is FDA approved for nasopharyngeal swab specimens only. Nasal  washings and aspirates  are unacceptable for Xpert Xpress Flu testing. Performed at [facility], [place] [address], [place], [place] [address]   Urinalysis, Routine w reflex microscopic     Status: Abnormal   Collection Time: 09/07/18  2:10 AM  Result Value Ref Range   Color, Urine AMBER (A) YELLOW    Comment: BIOCHEMICALS MAY BE AFFECTED BY COLOR   APPearance HAZY (A) CLEAR   Specific Gravity, Urine 1.030 1.005 - 1.030   pH 5.0 5.0 - 8.0   Glucose, UA NEGATIVE NEGATIVE mg/dL   Hgb urine dipstick NEGATIVE NEGATIVE   Bilirubin Urine NEGATIVE NEGATIVE   Ketones, ur 5 (A) NEGATIVE mg/dL   Protein, ur 30 (A) NEGATIVE mg/dL   Nitrite NEGATIVE NEGATIVE   Leukocytes, UA NEGATIVE NEGATIVE   RBC / HPF 0-5 0 - 5 RBC/hpf   WBC, UA 6-10 0 - 5 WBC/hpf   Bacteria, UA RARE (A) NONE SEEN   Squamous Epithelial / LPF 0-5 0 - 5   Mucus PRESENT    Hyaline Casts, UA PRESENT     Comment: Performed at [facility] [place] [address] [place], [place] [address]  Blood gas, arterial     Status: Abnormal   Collection Time: 09/07/18  2:30 AM  Result Value Ref Range   FIO2 60.00    Delivery systems VENTILATOR    Mode PRESSURE REGULATED VOLUME CONTROL    VT 0.620 mL   LHR 16 resp/min   Peep/cpap 5.0 cm H20   pH, Arterial 7.401 7.350 - 7.450   pCO2 arterial 45.4 32.0 - 48.0 mmHg   pO2, Arterial 141 (H) 83.0 - 108.0 mmHg   Bicarbonate 27.6 20.0 - 28.0 mmol/L   Acid-Base Excess 2.8 (H) 0.0 - 2.0 mmol/L   O2 Saturation 98.7 %   Patient temperature 98.6    Collection site RIGHT RADIAL    Drawn by [id number]    Sample type ARTERIAL DRAW    Allens test (pass/fail) PASS PASS    Comment: Performed at [facility], [place] [address], [place],  [address]  Lactic acid, plasma     Status: Abnormal   Collection Time: 09/07/18  2:33 AM  Result Value Ref Range   Lactic Acid, Venous  3.2 (HH) 0.5 - 1.9 mmol/L    Comment: CRITICAL RESULT CALLED TO, READ BACK BY AND VERIFIED  WITH: DOSTER,T RN @0420  ON 09/07/2018 JACKSON,K Performed at [facility], [place] [address], [place], [place] [address]   CBC     Status: Abnormal   Collection Time: 09/07/18  5:00 AM  Result Value Ref Range   WBC 12.1 (H) 4.0 - 10.5 K/uL   RBC 4.24 4.22 - 5.81 MIL/uL   Hemoglobin 13.0 13.0 - 17.0 g/dL   HCT 39.9 39.0 - 52.0 %   MCV 94.1 80.0 - 100.0 fL   MCH 30.7 26.0 - 34.0 pg   MCHC 32.6 30.0 - 36.0 g/dL   RDW 14.2 11.5 - 15.5 %   Platelets 216 150 - 400 K/uL   nRBC 0.0 0.0 - 0.2 %    Comment: Performed at [facility], [place] [address] [place], [place] [address]    Ct Abdomen Pelvis Wo Contrast  Result Date: 09/07/2018 CLINICAL DATA:  85 y/o M; chest and abdominal pain. Abdominal distention. Dyspnea. Nausea and vomiting. EXAM: CT CHEST, ABDOMEN AND PELVIS WITHOUT CONTRAST TECHNIQUE: Multidetector CT imaging of the chest, abdomen and pelvis was performed following the standard protocol without IV contrast. COMPARISON:  None. FINDINGS: CT CHEST FINDINGS Cardiovascular: Normal heart size. No pericardial effusion. Coronary artery calcific atherosclerosis. Thoracic aorta measures up to 4.3 cm. Mediastinum/Nodes: Marked distention of the esophagus with fluid level likely due to downstream obstruction. Normal thyroid. No mediastinal or axillary adenopathy. Lungs/Pleura: Patchy areas of consolidation within the left upper lobe lingula and right lower lobe with clustered nodules compatible with bronchopneumonia. Findings may represent aspiration. No pleural effusion or pneumothorax. Musculoskeletal: T8, T9, T11, T12, L1 compression deformities, age indeterminate. T11 augmentation. L4-5 grade 1 anterolisthesis. CT ABDOMEN PELVIS FINDINGS Hepatobiliary: Pneumobilia within the left lobe of the liver. Otherwise no focal liver abnormality is seen. No gallstones, gallbladder wall thickening, or biliary dilatation. Pancreas: Unremarkable. No pancreatic ductal dilatation or  surrounding inflammatory changes. Spleen: Normal in size without focal abnormality. Adrenals/Urinary Tract: Normal adrenal glands. Multiple renal cysts bilaterally measuring up to 6.3 cm arising from the left kidney upper pole. No urinary stone disease or hydronephrosis. Bladder is unremarkable. Stomach/Bowel: Massive distention of the stomach and proximal duodenum to the level of the junction between second and third segments of the duodenum (series 4, image 94). Massive distention of the stomach elevates the left hemidiaphragm. No obstructive or inflammatory changes of the small and large bowel downstream to the duodenum. Sigmoid diverticulosis. Normal appendix. Vascular/Lymphatic: Aortic atherosclerosis. No enlarged abdominal or pelvic lymph nodes. Reproductive: Prostate is unremarkable. Other: Small left inguinal hernia containing fat. Musculoskeletal: Right total hip prosthesis without periprosthetic lucency or fracture identified, incompletely visualized. The acetabular screw traverses the medial cortex of the acetabulum extending into the iliopsoas muscle (series 4, image 104). Subacute fractures with callus formation involving the left superior and inferior pubic rami. Right-greater-than-left sacral fractures. IMPRESSION: 1. Massive distention of the stomach and proximal duodenum. Findings may represent a stricture or obstructive mass of the duodenum. The distended stomach elevates the left hemidiaphragm and displaces rightward the mediastinum. 2. Distention of esophagus with fluid levels likely due to downstream duodenal obstruction. 3. Left upper lobe lingula and right lower lobe consolidation may reflect bronchopneumonia or possibly aspiration. 4. Thoracic aortic aneurysm measuring up to 4.3 cm. Recommend annual imaging followup by CTA or MRA. This recommendation follows 2010 ACCF/AHA/AATS/ACR/ASA/SCA/SCAI/SIR/STS/SVM Guidelines for the Diagnosis and Management of Patients with  Thoracic Aortic Disease.  2010; 121: R485-I627. 5. Coronary artery and aortic calcific 6. Subacute fractures of left superior and inferior pubic rami as well as right-greater-than-left sacrum. 7. T8, T9, T11, T12, L1 compression deformities, age indeterminate. Electronically Signed   By: Kristine Garbe M.D.   On: 09/07/2018 00:39   Dg Chest 2 View  Result Date: 08/13/2018 CLINICAL DATA:  Shortness of breath EXAM: CHEST - 2 VIEW COMPARISON:  None. FINDINGS: Marked elevation of the left hemidiaphragm and gaseous distention of the stomach beneath the left hemidiaphragm. Compressive atelectasis in the left lower lobe. Low volumes on the right without confluent airspace opacity. Heart appears to be normal size. No visible effusions or pneumothorax. No acute bony abnormality. IMPRESSION: Marked gaseous distention of the stomach and elevation of the left hemidiaphragm. Compressive atelectasis in the left lower lobe. Very low lung volumes. Electronically Signed   By: Rolm Baptise M.D.   On: 08/27/2018 23:11   Dg Abdomen 1 View  Result Date: 09/07/2018 CLINICAL DATA:  Orogastric tube placement EXAM: ABDOMEN - 1 VIEW COMPARISON:  CT 08/30/2018 FINDINGS: The tip and side port of a gastric tube are noted in the left upper quadrant in the expected location of the distended stomach. Stool is seen within nonobstructed, nondistended large bowel. Atelectasis is seen at the lung bases. Partially included right hip arthroplasty. IMPRESSION: Gastric tube with tip and side-port are seen in the expected location of the stomach. Electronically Signed   By: Ashley Royalty M.D.   On: 09/07/2018 02:20   Ct Chest Wo Contrast  Result Date: 09/07/2018 CLINICAL DATA:  85 y/o M; chest and abdominal pain. Abdominal distention. Dyspnea. Nausea and vomiting. EXAM: CT CHEST, ABDOMEN AND PELVIS WITHOUT CONTRAST TECHNIQUE: Multidetector CT imaging of the chest, abdomen and pelvis was performed following the standard protocol without IV contrast. COMPARISON:   None. FINDINGS: CT CHEST FINDINGS Cardiovascular: Normal heart size. No pericardial effusion. Coronary artery calcific atherosclerosis. Thoracic aorta measures up to 4.3 cm. Mediastinum/Nodes: Marked distention of the esophagus with fluid level likely due to downstream obstruction. Normal thyroid. No mediastinal or axillary adenopathy. Lungs/Pleura: Patchy areas of consolidation within the left upper lobe lingula and right lower lobe with clustered nodules compatible with bronchopneumonia. Findings may represent aspiration. No pleural effusion or pneumothorax. Musculoskeletal: T8, T9, T11, T12, L1 compression deformities, age indeterminate. T11 augmentation. L4-5 grade 1 anterolisthesis. CT ABDOMEN PELVIS FINDINGS Hepatobiliary: Pneumobilia within the left lobe of the liver. Otherwise no focal liver abnormality is seen. No gallstones, gallbladder wall thickening, or biliary dilatation. Pancreas: Unremarkable. No pancreatic ductal dilatation or surrounding inflammatory changes. Spleen: Normal in size without focal abnormality. Adrenals/Urinary Tract: Normal adrenal glands. Multiple renal cysts bilaterally measuring up to 6.3 cm arising from the left kidney upper pole. No urinary stone disease or hydronephrosis. Bladder is unremarkable. Stomach/Bowel: Massive distention of the stomach and proximal duodenum to the level of the junction between second and third segments of the duodenum (series 4, image 94). Massive distention of the stomach elevates the left hemidiaphragm. No obstructive or inflammatory changes of the small and large bowel downstream to the duodenum. Sigmoid diverticulosis. Normal appendix. Vascular/Lymphatic: Aortic atherosclerosis. No enlarged abdominal or pelvic lymph nodes. Reproductive: Prostate is unremarkable. Other: Small left inguinal hernia containing fat. Musculoskeletal: Right total hip prosthesis without periprosthetic lucency or fracture identified, incompletely visualized. The acetabular  screw traverses the medial cortex of the acetabulum extending into the iliopsoas muscle (series 4, image 104). Subacute fractures with callus formation involving the left  superior and inferior pubic rami. Right-greater-than-left sacral fractures. IMPRESSION: 1. Massive distention of the stomach and proximal duodenum. Findings may represent a stricture or obstructive mass of the duodenum. The distended stomach elevates the left hemidiaphragm and displaces rightward the mediastinum. 2. Distention of esophagus with fluid levels likely due to downstream duodenal obstruction. 3. Left upper lobe lingula and right lower lobe consolidation may reflect bronchopneumonia or possibly aspiration. 4. Thoracic aortic aneurysm measuring up to 4.3 cm. Recommend annual imaging followup by CTA or MRA. This recommendation follows 2010 ACCF/AHA/AATS/ACR/ASA/SCA/SCAI/SIR/STS/SVM Guidelines for the Diagnosis and Management of Patients with Thoracic Aortic Disease. 2010; 121: [id number]. 5. Coronary artery and aortic calcific 6. Subacute fractures of left superior and inferior pubic rami as well as right-greater-than-left sacrum. 7. T8, T9, T11, T12, L1 compression deformities, age indeterminate. Electronically Signed   By: Kristine Garbe M.D.   On: 09/07/2018 00:39   Dg Chest Port 1 View  Result Date: 09/07/2018 CLINICAL DATA:  Post intubation EXAM: PORTABLE CHEST 1 VIEW COMPARISON:  08/13/2018 FINDINGS: Endotracheal tube is been placed with tip measuring 5.4 cm above the carina. Enteric tube is been placed with tip over the left lower chest consistent with location in the stomach which on the previous study was demonstrated below and elevated left hemidiaphragm. The stomach has decompressed since the previous study. There is a shallow inspiration. Atelectasis or infiltration in both lung bases with probable small pleural effusions. Heart size is normal. No pneumothorax. Degenerative changes in the shoulders. IMPRESSION:  Appliances appear in satisfactory location. Shallow inspiration with atelectasis or infiltration in the lung bases and small bilateral pleural effusions. Electronically Signed   By: Lucienne Capers M.D.   On: 09/07/2018 02:16    Review of Systems  Constitutional: Positive for weight loss.  HENT: Negative.   Eyes: Negative.   Respiratory: Negative.   Cardiovascular: Negative.   Gastrointestinal: Positive for abdominal pain, nausea and vomiting.  Genitourinary: Negative.   Musculoskeletal: Positive for back pain.  Skin: Negative.   Neurological: Negative.   Endo/Heme/Allergies: Negative.   Psychiatric/Behavioral: Negative.    Blood pressure 100/71, pulse 98, temperature (!) 100.8 F (38.2 C), resp. rate 16, height 6' (1.829 m), weight 81.2 kg, SpO2 99 %. Physical Exam  Constitutional: He is oriented to person, place, and time.  Cachectic elderly bm intubated. Resting comfortably  HENT:  Head: Normocephalic and atraumatic.  Mouth/Throat: No oropharyngeal exudate.  Eyes: Pupils are equal, round, and reactive to light. Conjunctivae and EOM are normal.  Neck: Normal range of motion. Neck supple.  Cardiovascular: Normal rate, regular rhythm and normal heart sounds.  Respiratory: Effort normal and breath sounds normal. No stridor. No respiratory distress.  intubated  GI: Soft. Bowel sounds are normal. There is no abdominal tenderness.  Musculoskeletal: Normal range of motion.        General: No deformity or edema.  Neurological: He is alert and oriented to person, place, and time.  Intubated and sedated  Skin: Skin is warm and dry. No rash noted.  Psychiatric:  Intubated and sedated    Assessment/Plan: The patient appears to have a GOO and aspiration pneumonia. He will need mechanical ventilation until lungs recover. Continue ng decompression. Will consult GI for possible EGD to look for source of GOO. Will need resusciation in ICU. Will follow  Autumn Messing III 09/07/2018, 7:05 AM

## 2018-09-07 NOTE — Progress Notes (Signed)
PCCM Interval Note  I spoke with the patient's wife by phone and reviewed his status, the consultation information from gastroenterology and general surgery.  She understands the plans.  She indicates that she is discussed the patient's illness and goals of care with her children and that all agree that if he were to decompensate that he would not want ACLS or CPR.  That being said they do want to try and pursue medical interventions that may help determine whether he has a reversible problem.  This would include pressors if needed, also any procedures to help get a diagnosis.  I will place DNR orders in the chart.  Baltazar Apo, MD, PhD 09/07/2018, 3:31 PM [facility] [phone] or if no answer [phone]

## 2018-09-07 DEATH — deceased

## 2018-09-08 ENCOUNTER — Encounter (HOSPITAL_COMMUNITY): Payer: Self-pay | Admitting: Internal Medicine

## 2018-09-08 ENCOUNTER — Inpatient Hospital Stay (HOSPITAL_COMMUNITY): Payer: Medicare (Managed Care)

## 2018-09-08 ENCOUNTER — Encounter (HOSPITAL_COMMUNITY): Admission: RE | Disposition: E | Payer: Self-pay | Source: Home / Self Care | Attending: Emergency Medicine

## 2018-09-08 DIAGNOSIS — J9602 Acute respiratory failure with hypercapnia: Secondary | ICD-10-CM

## 2018-09-08 DIAGNOSIS — K3189 Other diseases of stomach and duodenum: Secondary | ICD-10-CM

## 2018-09-08 HISTORY — PX: BIOPSY: SHX5522

## 2018-09-08 HISTORY — PX: ESOPHAGOGASTRODUODENOSCOPY: SHX5428

## 2018-09-08 LAB — BASIC METABOLIC PANEL
Anion gap: 13 (ref 5–15)
BUN: 23 mg/dL (ref 8–23)
CHLORIDE: 99 mmol/L (ref 98–111)
CO2: 26 mmol/L (ref 22–32)
Calcium: 8 mg/dL — ABNORMAL LOW (ref 8.9–10.3)
Creatinine, Ser: 1.03 mg/dL (ref 0.61–1.24)
GFR calc Af Amer: >60 mL/min (ref >60)
GFR calc non Af Amer: >60 mL/min (ref >60)
Glucose, Bld: 110 mg/dL — ABNORMAL HIGH (ref 70–99)
Potassium: 3.2 mmol/L — ABNORMAL LOW (ref 3.5–5.1)
Sodium: 138 mmol/L (ref 135–145)

## 2018-09-08 LAB — CBC
HEMATOCRIT: 36.3 % — AB (ref 39.0–52.0)
Hemoglobin: 11.8 g/dL — ABNORMAL LOW (ref 13.0–17.0)
MCH: 29.6 pg (ref 26.0–34.0)
MCHC: 32.5 g/dL (ref 30.0–36.0)
MCV: 91 fL (ref 80.0–100.0)
Platelets: 166 10*3/uL (ref 150–400)
RBC: 3.99 MIL/uL — ABNORMAL LOW (ref 4.22–5.81)
RDW: 14.2 % (ref 11.5–15.5)
WBC: 11.6 10*3/uL — ABNORMAL HIGH (ref 4.0–10.5)
nRBC: 0 % (ref 0.0–0.2)

## 2018-09-08 LAB — MAGNESIUM: Magnesium: 1.8 mg/dL (ref 1.7–2.4)

## 2018-09-08 LAB — MRSA PCR SCREENING: MRSA by PCR: NEGATIVE

## 2018-09-08 SURGERY — EGD (ESOPHAGOGASTRODUODENOSCOPY)
Anesthesia: Moderate Sedation

## 2018-09-08 MED ORDER — FLUCONAZOLE IN SODIUM CHLORIDE 200-0.9 MG/100ML-% IV SOLN
200.0000 mg | Freq: Once | INTRAVENOUS | Status: AC
  Administered 2018-09-08: 200 mg via INTRAVENOUS
  Filled 2018-09-08: qty 100

## 2018-09-08 MED ORDER — PANTOPRAZOLE SODIUM 40 MG IV SOLR
40.0000 mg | Freq: Two times a day (BID) | INTRAVENOUS | Status: DC
  Administered 2018-09-08 – 2018-09-11 (×7): 40 mg via INTRAVENOUS
  Filled 2018-09-08 (×7): qty 40

## 2018-09-08 MED ORDER — FENTANYL CITRATE (PF) 100 MCG/2ML IJ SOLN
INTRAMUSCULAR | Status: AC
  Filled 2018-09-08: qty 4

## 2018-09-08 MED ORDER — FLUCONAZOLE 100MG IVPB
100.0000 mg | INTRAVENOUS | Status: DC
  Administered 2018-09-09 – 2018-09-10 (×2): 100 mg via INTRAVENOUS
  Filled 2018-09-08 (×4): qty 50

## 2018-09-08 MED ORDER — MIDAZOLAM HCL (PF) 5 MG/ML IJ SOLN
INTRAMUSCULAR | Status: AC
  Filled 2018-09-08: qty 3

## 2018-09-08 MED ORDER — SODIUM CHLORIDE 0.9 % IV SOLN: INTRAVENOUS | Status: DC

## 2018-09-08 NOTE — Progress Notes (Signed)
Initial Nutrition Assessment  INTERVENTION:   Please consult if pt is to remain intubated and needs nutrition support  NUTRITION DIAGNOSIS:   Inadequate oral intake related to inability to eat as evidenced by NPO status.  GOAL:   Patient will meet greater than or equal to 90% of their needs  MONITOR:   Vent status, Labs, Weight trends, I & O's, Skin  REASON FOR ASSESSMENT:   Ventilator   ASSESSMENT:   85 year old male admitted with several days of worsening abdominal pain, nausea and vomiting in the setting of weight loss found to have a gastric outlet obstruction due to duodenal obstruction.   Patient not in room at time of visit. Pt having EGD.  Per chart review, pt was not eating well for 3-4 days PTA d/t abdominal pain and N/V. Was not able to keep food or fluids down.   Pt currently on bowel rest with OGT to suction.  Will continue to monitor for plan.  Patient is currently intubated on ventilator support since 2/1. MV: 11.4 L/min -per flowsheets Temp (24hrs), Avg:100.3 F (37.9 C), Min:99.7 F (37.6 C), Max:101.1 F (38.4 C)  Propofol: 24.4 ml/hr -provides 644 kcal  Medications reviewed. Labs reviewed: CBGs: 111-118 Low K Mg/Phos WNL  NUTRITION - FOCUSED PHYSICAL EXAM:  Pt having EGD.  Diet Order:   Diet Order            Diet NPO time specified  Diet effective now              EDUCATION NEEDS:   Not appropriate for education at this time  Skin:  Skin Assessment: Skin Integrity Issues: Skin Integrity Issues:: Unstageable Unstageable: sacrum  Last BM:  PTA  Height:   Ht Readings from Last 1 Encounters:  09/07/2018 6' (1.829 m)    Weight:   Wt Readings from Last 1 Encounters:  10/02/2018 74 kg    Ideal Body Weight:  80.9 kg  BMI:  Body mass index is 22.13 kg/m.  Estimated Nutritional Needs:   Kcal:  1942  Protein:  100-110g  Fluid:  1.8L/day  Clayton Bibles, MS, RD, LDN [facility] Pager:  [phone] After Hours Pager: [phone]

## 2018-09-08 NOTE — Progress Notes (Signed)
Subjective/Chief Complaint: Intubated and sedated   Objective: Vital signs in last 24 hours: Temp:  [99.7 F (37.6 C)-101.3 F (38.5 C)] 99.7 F (37.6 C) (02/02 0803) Pulse Rate:  [74-96] 85 (02/02 0803) Resp:  [10-22] 18 (02/02 0803) BP: (90-119)/(48-97) 99/64 (02/02 0803) SpO2:  [95 %-100 %] 100 % (02/02 0803) FiO2 (%):  [30 %-40 %] 30 % (02/02 0741) Weight:  [72.7 kg-74 kg] 74 kg (02/02 0130) Last BM Date: (PTA)  Intake/Output from previous day: 02/01 0701 - 02/02 0700 In: 3363.4 [I.V.:2685.7; IV Piggyback:677.7] Out: 945 [Urine:220; Emesis/NG output:725] Intake/Output this shift: Total I/O In: 34.1 [I.V.:34.1] Out: -   General appearance: sedated on vent Resp: clear to auscultation bilaterally Cardio: regular rate and rhythm GI: soft, not distended  Lab Results:  Recent Labs    09/07/18 0500 10/02/2018 0350  WBC 12.1* 11.6*  HGB 13.0 11.8*  HCT 39.9 36.3*  PLT 216 166   BMET Recent Labs    09/07/18 0500 09/07/2018 0350  NA 137 138  K 2.9* 3.2*  CL 96* 99  CO2 26 26  GLUCOSE 166* 110*  BUN 26* 23  CREATININE 1.13 1.03  CALCIUM 8.9 8.0*   PT/INR Recent Labs    08/23/2018 2235  LABPROT 12.6  INR 0.96   ABG Recent Labs    09/07/18 0230  PHART 7.401  HCO3 27.6    Studies/Results: Ct Abdomen Pelvis Wo Contrast  Result Date: 09/07/2018 CLINICAL DATA:  85 y/o M; chest and abdominal pain. Abdominal distention. Dyspnea. Nausea and vomiting. EXAM: CT CHEST, ABDOMEN AND PELVIS WITHOUT CONTRAST TECHNIQUE: Multidetector CT imaging of the chest, abdomen and pelvis was performed following the standard protocol without IV contrast. COMPARISON:  None. FINDINGS: CT CHEST FINDINGS Cardiovascular: Normal heart size. No pericardial effusion. Coronary artery calcific atherosclerosis. Thoracic aorta measures up to 4.3 cm. Mediastinum/Nodes: Marked distention of the esophagus with fluid level likely due to downstream obstruction. Normal thyroid. No mediastinal or  axillary adenopathy. Lungs/Pleura: Patchy areas of consolidation within the left upper lobe lingula and right lower lobe with clustered nodules compatible with bronchopneumonia. Findings may represent aspiration. No pleural effusion or pneumothorax. Musculoskeletal: T8, T9, T11, T12, L1 compression deformities, age indeterminate. T11 augmentation. L4-5 grade 1 anterolisthesis. CT ABDOMEN PELVIS FINDINGS Hepatobiliary: Pneumobilia within the left lobe of the liver. Otherwise no focal liver abnormality is seen. No gallstones, gallbladder wall thickening, or biliary dilatation. Pancreas: Unremarkable. No pancreatic ductal dilatation or surrounding inflammatory changes. Spleen: Normal in size without focal abnormality. Adrenals/Urinary Tract: Normal adrenal glands. Multiple renal cysts bilaterally measuring up to 6.3 cm arising from the left kidney upper pole. No urinary stone disease or hydronephrosis. Bladder is unremarkable. Stomach/Bowel: Massive distention of the stomach and proximal duodenum to the level of the junction between second and third segments of the duodenum (series 4, image 94). Massive distention of the stomach elevates the left hemidiaphragm. No obstructive or inflammatory changes of the small and large bowel downstream to the duodenum. Sigmoid diverticulosis. Normal appendix. Vascular/Lymphatic: Aortic atherosclerosis. No enlarged abdominal or pelvic lymph nodes. Reproductive: Prostate is unremarkable. Other: Small left inguinal hernia containing fat. Musculoskeletal: Right total hip prosthesis without periprosthetic lucency or fracture identified, incompletely visualized. The acetabular screw traverses the medial cortex of the acetabulum extending into the iliopsoas muscle (series 4, image 104). Subacute fractures with callus formation involving the left superior and inferior pubic rami. Right-greater-than-left sacral fractures. IMPRESSION: 1. Massive distention of the stomach and proximal  duodenum. Findings may represent a stricture  or obstructive mass of the duodenum. The distended stomach elevates the left hemidiaphragm and displaces rightward the mediastinum. 2. Distention of esophagus with fluid levels likely due to downstream duodenal obstruction. 3. Left upper lobe lingula and right lower lobe consolidation may reflect bronchopneumonia or possibly aspiration. 4. Thoracic aortic aneurysm measuring up to 4.3 cm. Recommend annual imaging followup by CTA or MRA. This recommendation follows 2010 ACCF/AHA/AATS/ACR/ASA/SCA/SCAI/SIR/STS/SVM Guidelines for the Diagnosis and Management of Patients with Thoracic Aortic Disease. 2010; 121: H474-Q595. 5. Coronary artery and aortic calcific 6. Subacute fractures of left superior and inferior pubic rami as well as right-greater-than-left sacrum. 7. T8, T9, T11, T12, L1 compression deformities, age indeterminate. Electronically Signed   By: Kristine Garbe M.D.   On: 09/07/2018 00:39   Dg Chest 2 View  Result Date: 08/16/2018 CLINICAL DATA:  Shortness of breath EXAM: CHEST - 2 VIEW COMPARISON:  None. FINDINGS: Marked elevation of the left hemidiaphragm and gaseous distention of the stomach beneath the left hemidiaphragm. Compressive atelectasis in the left lower lobe. Low volumes on the right without confluent airspace opacity. Heart appears to be normal size. No visible effusions or pneumothorax. No acute bony abnormality. IMPRESSION: Marked gaseous distention of the stomach and elevation of the left hemidiaphragm. Compressive atelectasis in the left lower lobe. Very low lung volumes. Electronically Signed   By: Rolm Baptise M.D.   On: 08/21/2018 23:11   Dg Abdomen 1 View  Result Date: 09/07/2018 CLINICAL DATA:  Orogastric tube placement EXAM: ABDOMEN - 1 VIEW COMPARISON:  CT 09/05/2018 FINDINGS: The tip and side port of a gastric tube are noted in the left upper quadrant in the expected location of the distended stomach. Stool is seen within  nonobstructed, nondistended large bowel. Atelectasis is seen at the lung bases. Partially included right hip arthroplasty. IMPRESSION: Gastric tube with tip and side-port are seen in the expected location of the stomach. Electronically Signed   By: Ashley Royalty M.D.   On: 09/07/2018 02:20   Ct Chest Wo Contrast  Result Date: 09/07/2018 CLINICAL DATA:  85 y/o M; chest and abdominal pain. Abdominal distention. Dyspnea. Nausea and vomiting. EXAM: CT CHEST, ABDOMEN AND PELVIS WITHOUT CONTRAST TECHNIQUE: Multidetector CT imaging of the chest, abdomen and pelvis was performed following the standard protocol without IV contrast. COMPARISON:  None. FINDINGS: CT CHEST FINDINGS Cardiovascular: Normal heart size. No pericardial effusion. Coronary artery calcific atherosclerosis. Thoracic aorta measures up to 4.3 cm. Mediastinum/Nodes: Marked distention of the esophagus with fluid level likely due to downstream obstruction. Normal thyroid. No mediastinal or axillary adenopathy. Lungs/Pleura: Patchy areas of consolidation within the left upper lobe lingula and right lower lobe with clustered nodules compatible with bronchopneumonia. Findings may represent aspiration. No pleural effusion or pneumothorax. Musculoskeletal: T8, T9, T11, T12, L1 compression deformities, age indeterminate. T11 augmentation. L4-5 grade 1 anterolisthesis. CT ABDOMEN PELVIS FINDINGS Hepatobiliary: Pneumobilia within the left lobe of the liver. Otherwise no focal liver abnormality is seen. No gallstones, gallbladder wall thickening, or biliary dilatation. Pancreas: Unremarkable. No pancreatic ductal dilatation or surrounding inflammatory changes. Spleen: Normal in size without focal abnormality. Adrenals/Urinary Tract: Normal adrenal glands. Multiple renal cysts bilaterally measuring up to 6.3 cm arising from the left kidney upper pole. No urinary stone disease or hydronephrosis. Bladder is unremarkable. Stomach/Bowel: Massive distention of the stomach  and proximal duodenum to the level of the junction between second and third segments of the duodenum (series 4, image 94). Massive distention of the stomach elevates the left hemidiaphragm. No obstructive or  inflammatory changes of the small and large bowel downstream to the duodenum. Sigmoid diverticulosis. Normal appendix. Vascular/Lymphatic: Aortic atherosclerosis. No enlarged abdominal or pelvic lymph nodes. Reproductive: Prostate is unremarkable. Other: Small left inguinal hernia containing fat. Musculoskeletal: Right total hip prosthesis without periprosthetic lucency or fracture identified, incompletely visualized. The acetabular screw traverses the medial cortex of the acetabulum extending into the iliopsoas muscle (series 4, image 104). Subacute fractures with callus formation involving the left superior and inferior pubic rami. Right-greater-than-left sacral fractures. IMPRESSION: 1. Massive distention of the stomach and proximal duodenum. Findings may represent a stricture or obstructive mass of the duodenum. The distended stomach elevates the left hemidiaphragm and displaces rightward the mediastinum. 2. Distention of esophagus with fluid levels likely due to downstream duodenal obstruction. 3. Left upper lobe lingula and right lower lobe consolidation may reflect bronchopneumonia or possibly aspiration. 4. Thoracic aortic aneurysm measuring up to 4.3 cm. Recommend annual imaging followup by CTA or MRA. This recommendation follows 2010 ACCF/AHA/AATS/ACR/ASA/SCA/SCAI/SIR/STS/SVM Guidelines for the Diagnosis and Management of Patients with Thoracic Aortic Disease. 2010; 121: P530-Y511. 5. Coronary artery and aortic calcific 6. Subacute fractures of left superior and inferior pubic rami as well as right-greater-than-left sacrum. 7. T8, T9, T11, T12, L1 compression deformities, age indeterminate. Electronically Signed   By: Kristine Garbe M.D.   On: 09/07/2018 00:39   Dg Chest Port 1  View  Result Date: 09/07/2018 CLINICAL DATA:  Post intubation EXAM: PORTABLE CHEST 1 VIEW COMPARISON:  08/31/2018 FINDINGS: Endotracheal tube is been placed with tip measuring 5.4 cm above the carina. Enteric tube is been placed with tip over the left lower chest consistent with location in the stomach which on the previous study was demonstrated below and elevated left hemidiaphragm. The stomach has decompressed since the previous study. There is a shallow inspiration. Atelectasis or infiltration in both lung bases with probable small pleural effusions. Heart size is normal. No pneumothorax. Degenerative changes in the shoulders. IMPRESSION: Appliances appear in satisfactory location. Shallow inspiration with atelectasis or infiltration in the lung bases and small bilateral pleural effusions. Electronically Signed   By: Lucienne Capers M.D.   On: 09/07/2018 02:16    Anti-infectives: Anti-infectives (From admission, onward)   Start     Dose/Rate Route Frequency Ordered Stop   09/07/18 0345  cefTRIAXone (ROCEPHIN) 1 g in sodium chloride 0.9 % 100 mL IVPB     1 g 200 mL/hr over 30 Minutes Intravenous Daily at bedtime 09/07/18 0333     09/07/18 0345  azithromycin (ZITHROMAX) 500 mg in sodium chloride 0.9 % 250 mL IVPB     500 mg 250 mL/hr over 60 Minutes Intravenous Daily at bedtime 09/07/18 0333 09/09/18 2159      Assessment/Plan: s/p Procedure(s): ESOPHAGOGASTRODUODENOSCOPY (EGD) (N/A) Continue ng and bowel rest  GOO. GI to eval with upper endo Continue abx for pneumonia  LOS: 1 day    Autumn Messing III 09/28/2018

## 2018-09-08 NOTE — Interval H&P Note (Signed)
History and Physical Interval Note: Remains intubated and sedated in ICU 750 dark bilious liquid per OG overnight Family and ICU team in agreement with bedside EGD to try to determine cause for obstruction/GOO HIGHER THAN BASELINE RISK.The nature of the procedure, as well as the risks, benefits, and alternatives were carefully and thoroughly reviewed with the patient. Ample time for discussion and questions allowed. The patient understood, was satisfied, and agreed to proceed.     09/14/2018 9:09 AM  Andres Cruz  has presented today for surgery, with the diagnosis of Gastric outlet obstruction  The various methods of treatment have been discussed with the patient and family. After consideration of risks, benefits and other options for treatment, the patient has consented to  Procedure(s): ESOPHAGOGASTRODUODENOSCOPY (EGD) (N/A) as a surgical intervention .  The patient's history has been reviewed, patient examined, no change in status, stable for surgery.  I have reviewed the patient's chart and labs.  Questions were answered to the patient's satisfaction.     [name]

## 2018-09-08 NOTE — Op Note (Signed)
[facility] Patient Name: Andres Cruz Procedure Date: 09/10/2018 MRN: [id number] Attending MD: Jerene Bears , MD Date of Birth: 07/25/1929 CSN: [id number] Age: 85 Admit Type: Inpatient Procedure:                Upper GI endoscopy Indications:              Abnormal CT of the GI tract, Suspected duodenal                            obstruction Providers:                Lajuan Lines. Hilarie Fredrickson, MD, Raynelle Bring, RN, Vista Lawman,                            RN, Laverda Sorenson, Technician Referring MD:             Autumn Messing III, MD Medicines:                Propofol per ICU team Complications:            No immediate complications. Estimated Blood Loss:     Estimated blood loss was minimal. Procedure:                Pre-Anesthesia Assessment:                           - Prior to the procedure, a History and Physical                            was performed, and patient medications and                            allergies were reviewed. The patient's tolerance of                            previous anesthesia was also reviewed. The risks                            and benefits of the procedure and the sedation                            options and risks were discussed with the patient.                            All questions were answered, and informed consent                            was obtained. Prior Anticoagulants: The patient has                            taken no previous anticoagulant or antiplatelet                            agents. ASA Grade Assessment: IV - A patient with  severe systemic disease that is a constant threat                            to life. After reviewing the risks and benefits,                            the patient was deemed in satisfactory condition to                            undergo the procedure.                           After obtaining informed consent, the endoscope was                            passed under  direct vision. Throughout the                            procedure, the patient's blood pressure, pulse, and                            oxygen saturations were monitored continuously. The                            [id number]-PH190L ([id number] Olympus ultra slim endoscope                            was introduced through the mouth, and advanced to                            the second part of duodenum. The upper GI endoscopy                            was accomplished without difficulty. The patient                            tolerated the procedure well. Scope In: Scope Out: Findings:      Severe esophagitis with no bleeding was found in the lower third of the       esophagus. This is consistent with reflux and Candidiasis.      Retained fluid and food debris was found in the gastric fundus. This       area was unable to be cleared completely via the suction channel, thus       some proximal gastric mucosa nonvisualized.      Diffuse mild inflammation characterized by erythema was found in the       gastric body and in the gastric antrum. No ulcerations or masses seen.      The duodenal bulb was normal.      A large frond-like/villous and fungating circumferential mass with       contact bleeding was found in the second portion of the duodenum. This       causing obstruction and the ultraslim colonoscope was unable to traverse       this lesion. There was suggestion that this circumferential mass is  fairly short in luminal length. Multiple biopsies were taken with a cold       forceps for histology. Impression:               - Severe reflux and candidiasis esophagitis.                           - Retained gastric fluid/food.                           - Mild gastritis.                           - Normal duodenal bulb.                           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 Moderate Sedation:      N/A Recommendation:            - Return patient to ICU for ongoing care.                           - NPO. Will continued aggressive care will need                            parenteral nutrition.                           - Continue present medications. Would add                            fluconazole for esophagitis.                           - Await pathology results.                           - Depending on overall course and decisions with                            family, if he clinically improves (respiratory                            status with pneumonia), a duodenal stent could be                            considered for palliative treatment only. I have                            explained to the wife that this lesion is malignant                            and very likely not curable or operable. 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 All rights reserved. The codes documented in this report are preliminary and upon coder review may  be revised to meet current compliance requirements. Jerene Bears, MD 09/10/2018 10:25:08 AM This report has been signed electronically. Number of Addenda: 0

## 2018-09-08 NOTE — Progress Notes (Signed)
NAME:  Andres Cruz, MRN:  [id number], DOB:  [birth year], LOS: 1 ADMISSION DATE:  09/05/2018, CONSULTATION DATE:  09/07/2018 REFERRING MD:  ED CHIEF COMPLAINT:  Nausea, vomiting  Brief History   85 year old who presented to the ED with nausea and vomiting and found to have gastric outlet obstruction.   History of present illness   85 year old with history of BPH, hypertension, and recent fall sustaining pubic fractures who presented with nausea vomiting and abdominal pain.  Patient is intubated.  History is obtained from wife who is at the bedside as well as chart review.  Per patient's wife patient was in his usual state of health until 3 to 4 days ago.  Patient started experiencing nausea and vomiting.  She states that patient was also complaining of epigastric pain.  States that the vomiting got worse and patient was not able to keep anything down including water.  She also reports a few episodes of diarrhea.  She denies hematemesis.  She also endorses cough and sputum production.  She denies fever but states that the patient had experienced chills.   Patient presented to the ED where CT abdomen and pelvis done showed massive distention of the stomach and proximal duodenum.  The distended stomach elevates the left hemidiaphragm and displaces the mediastinum to the right.  His chest CT was also concerning for left upper lobe and right lower lobe consolidation.  Patient was intubated in the ED due to difficulty inserting NG for decompression and put on propofol. OG tube was place and put on suction.  Surgery was consulted  Past Medical History  -Hypertension -Hyperlipidemia -Pancreatic insufficiency -BPH  Significant Hospital Events   -1/31-intubated  Consults:  -Surgery consult -PCCM  Procedures:  -1/31-intubation  Significant Diagnostic Tests:  -CT chest, abdomen and Pelvis  Massive distention of the stomach and proximal duodenum. Findings may represent a stricture or obstructive  mass of the duodenum. The distended stomach elevates the left hemidiaphragm and displaces rightward the mediastinum  Micro Data:  Blood 1/31 >>   Antimicrobials:  -None    Subjective/interval events: No problems reported overnight He has had over 400 cc suctioned from G-tube over the last shift   Objective   Blood pressure (!) 100/53, pulse 74, temperature 99.9 F (37.7 C), temperature source Core, resp. rate 16, height 6' (1.829 m), weight 74 kg, SpO2 100 %.    Vent Mode: PRVC FiO2 (%):  [30 %-40 %] 30 % Set Rate:  [16 bmp] 16 bmp Vt Set:  [620 mL] 620 mL PEEP:  [5 cmH20] 5 cmH20 Plateau Pressure:  [18 cmH20-20 cmH20] 18 cmH20   Intake/Output Summary (Last 24 hours) at 09/16/2018 0347 Last data filed at 09/07/2018 0720 Gross per 24 hour  Intake 3382.94 ml  Output 945 ml  Net 2437.94 ml   Filed Weights   09/07/18 0158 09/07/18 1215 09/11/2018 0130  Weight: 81.2 kg 72.7 kg 74 kg    Examination: General: Thin elderly man, intubated, lying in bed comfortably HENT: ET tube in place, oropharynx otherwise clear, pupils equal Lungs: Decreased at both bases, no crackles, no wheezes Cardiovascular: Regular, no murmur Abdomen: Distended, no apparent tenderness (although on sedation), positive bowel sounds Extremities: No deformity, no edema Neuro: Sedated, does not wake to voice or stimulation (propofol 40) GU: Foley catheter  Assessment & Plan:  Gastric outlet obstruction Continue NG tube suction Appreciate gastroenterology and CCS evaluations.  Planning for EGD on 2/2 morning.  Based on results we will need to talk about  next steps, coordinate with surgery Continue volume repletion  Respiratory failure on mechanical ventilation Pneumonia Possible community-acquired versus aspiration pneumonia Continue PRVC 8 cc/kg.  No plans to initiate spontaneous breathing until he can have his EGD and make a strategy for addressing his gastric obstruction Currently empirically covered for  community pneumonia with ceftriaxone and azithromycin; probably at adequate although if he decompensates, spikes fever then consider additional coverage for anaerobes Blood cultures pending  Hypokalemia Replace and follow BMP  At risk for acute renal failure, no evidence currently Follow urine output, BMP  Multiple fractures He sustained a fall back in December.  His imaging shows Subacute fractures of left superior and inferior pubic rami as well as right-greater-than-left sacrum. T8, T9, T11, T12, L1 compression deformities Pain control, conservative management PT/OT when appropriate  Hyperlipidemia Statin on hold  History of hypertension Antihypertensive regimen on hold  History of pancreatic insufficiency Pancrelipase on hold   Best practice:  Diet: NPO Pain/Anxiety/Delirium protocol (if indicated): PRN fentanyl VAP protocol (if indicated): HoB elevation, oral care DVT prophylaxis: SCDs, hold pharmacological prophylaxis for now incase he goes to surgery GI prophylaxis: Famotidine Glucose control: maintain euglycemia Mobility: PT/OT once appropriate Code Status: Full code  Family Communication: I updated his wife at bedside on 2/2 Disposition: ICU status  Labs   CBC: Recent Labs  Lab 08/30/2018 2235 09/07/18 0500 10/04/2018 0350  WBC 15.8* 12.1* 11.6*  NEUTROABS 12.9*  --   --   HGB 13.3 13.0 11.8*  HCT 42.0 39.9 36.3*  MCV 93.1 94.1 91.0  PLT 272 216 631    Basic Metabolic Panel: Recent Labs  Lab 08/20/2018 2235 09/07/18 0500 09/11/2018 0350  NA 138 137 138  K 3.7 2.9* 3.2*  CL 96* 96* 99  CO2 27 26 26   GLUCOSE 187* 166* 110*  BUN 24* 26* 23  CREATININE 1.16 1.13 1.03  CALCIUM 9.5 8.9 8.0*  MG  --  1.8 1.8  PHOS  --  3.5  --    GFR: Estimated Creatinine Clearance: 52.9 mL/min (by C-G formula based on SCr of 1.03 mg/dL). Recent Labs  Lab 08/09/2018 2235 09/07/18 0233 09/07/18 0500 09/25/2018 0350  WBC 15.8*  --  12.1* 11.6*  LATICACIDVEN 2.9* 3.2*   --   --     Liver Function Tests: Recent Labs  Lab 08/25/2018 2235  AST 29  ALT 12  ALKPHOS 186*  BILITOT 1.5*  PROT 7.8  ALBUMIN 4.4   No results for input(s): LIPASE, AMYLASE in the last 168 hours. No results for input(s): AMMONIA in the last 168 hours.  ABG    Component Value Date/Time   PHART 7.401 09/07/2018 0230   PCO2ART 45.4 09/07/2018 0230   PO2ART 141 (H) 09/07/2018 0230   HCO3 27.6 09/07/2018 0230   O2SAT 98.7 09/07/2018 0230     Coagulation Profile: Recent Labs  Lab 08/14/2018 2235  INR 0.96    Cardiac Enzymes: No results for input(s): CKTOTAL, CKMB, CKMBINDEX, TROPONINI in the last 168 hours.  HbA1C: No results found for: HGBA1C  CBG: Recent Labs  Lab 09/07/18 1148 09/07/18 1607 09/07/18 2207  GLUCAP 129* 118* 111*   Critical care time 34 minutes  Baltazar Apo, MD, PhD 10/04/2018, 8:08 AM [facility] [phone] or if no answer [phone]

## 2018-09-09 ENCOUNTER — Encounter (HOSPITAL_COMMUNITY): Payer: Self-pay | Admitting: Internal Medicine

## 2018-09-09 DIAGNOSIS — L8990 Pressure ulcer of unspecified site, unspecified stage: Secondary | ICD-10-CM

## 2018-09-09 LAB — BASIC METABOLIC PANEL
Anion gap: 12 (ref 5–15)
BUN: 23 mg/dL (ref 8–23)
CALCIUM: 7.8 mg/dL — AB (ref 8.9–10.3)
CO2: 24 mmol/L (ref 22–32)
Chloride: 104 mmol/L (ref 98–111)
Creatinine, Ser: 0.92 mg/dL (ref 0.61–1.24)
GFR calc Af Amer: >60 mL/min (ref >60)
GFR calc non Af Amer: >60 mL/min (ref >60)
Glucose, Bld: 101 mg/dL — ABNORMAL HIGH (ref 70–99)
Potassium: 2.8 mmol/L — ABNORMAL LOW (ref 3.5–5.1)
SODIUM: 140 mmol/L (ref 135–145)

## 2018-09-09 LAB — MAGNESIUM: Magnesium: 2.1 mg/dL (ref 1.7–2.4)

## 2018-09-09 LAB — CBC
HCT: 36.4 % — ABNORMAL LOW (ref 39.0–52.0)
Hemoglobin: 11.5 g/dL — ABNORMAL LOW (ref 13.0–17.0)
MCH: 29.3 pg (ref 26.0–34.0)
MCHC: 31.6 g/dL (ref 30.0–36.0)
MCV: 92.9 fL (ref 80.0–100.0)
PLATELETS: 187 10*3/uL (ref 150–400)
RBC: 3.92 MIL/uL — ABNORMAL LOW (ref 4.22–5.81)
RDW: 14.3 % (ref 11.5–15.5)
WBC: 11.3 10*3/uL — ABNORMAL HIGH (ref 4.0–10.5)
nRBC: 0 % (ref 0.0–0.2)

## 2018-09-09 MED ORDER — POTASSIUM CHLORIDE 10 MEQ/100ML IV SOLN
10.0000 meq | INTRAVENOUS | Status: AC
  Administered 2018-09-09 (×6): 10 meq via INTRAVENOUS
  Filled 2018-09-09 (×6): qty 100

## 2018-09-09 NOTE — Progress Notes (Signed)
[facility] Progress Note  Chief Complaint: Gastric outlet obstruction  History:  Patient remains critically ill, intubated and sedated on PRVC. OG tube remains in place draining dark liquid. His wife is at the bedside, and we spoke extensively.  She is aware from conversation with Dr. 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   ROS: Unable to obtain due to patient's condition.  Objective:  Med list reviewed  Vital signs in last 24 hrs: Vitals:   09/09/18 0830 09/09/18 0900  BP: 108/66 (!) 108/57  Pulse: 81 68  Resp: 17 16  Temp: 99.3 F (37.4 C) 99.5 F (37.5 C)  SpO2: 100% 100%    Physical Exam Temporal wasting, generalized poor muscle mass.  Chronically ill-appearing elderly man debated and sedated  HEENT: sclera anicteric, ETT and OGT in place  Cardiac: RRR without appreciable murmurs, S1S2 heard, no peripheral edema  Pulm: Faint rhonchi bilaterally, good air entry on vent, sedated, not breathing over the vent getting volumes over 600 cc  Abdomen: soft, no apparent tenderness, with active bowel sounds.  No apparent hepatosplenomegaly  Skin; warm and dry, no jaundice Neuro: Unable to perform since sedated Recent Labs:  Recent Labs  Lab 09/07/18 0500 09/07/2018 0350 09/09/18 0354  WBC 12.1* 11.6* 11.3*  HGB 13.0 11.8* 11.5*  HCT 39.9 36.3* 36.4*  PLT 216 166 187   Recent Labs  Lab 08/20/2018 2235  INR 0.96   CMP Latest Ref Rng & Units 09/09/2018 09/10/2018 09/07/2018  Glucose 70 - 99 mg/dL 101(H) 110(H) 166(H)  BUN 8 - 23 mg/dL 23 23 26(H)  Creatinine 0.61 - 1.24 mg/dL 0.92 1.03 1.13  Sodium 135 - 145 mmol/L 140 138 137  Potassium 3.5 - 5.1 mmol/L 2.8(L) 3.2(L) 2.9(L)  Chloride 98 - 111 mmol/L 104 99 96(L)  CO2 22 - 32 mmol/L 24 26 26   Calcium 8.9 - 10.3 mg/dL 7.8(L) 8.0(L) 8.9  Total Protein 6.5 - 8.1 g/dL - - -  Total Bilirubin 0.3 - 1.2 mg/dL - - -  Alkaline Phos 38 - 126 U/L - - -  AST 15 - 41 U/L - -  -  ALT 0 - 44 U/L - - -     Radiologic studies:  None new today. I personally reviewed the CT abdomen at the time of admission showing obstructing mass in the second/third portion of duodenum with proximal dilatation and massive gastric distention.  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  I reviewed Dr. Vena Rua upper endoscopy report and discussed the case with him from signout last evening.  The possibility of a duodenal stent was briefly discussed, but it is not yet clear if that is technically feasible since we most likely need upper GI series to define the anatomy better and make measurements to see if stent can be placed.  All of this is secondary to the patient's respiratory status, which is still tenuous and questionable.  His wife is quite clear that she does not want him to suffer, she understands this is a noncurable malignancy, we will see what the next couple of days brings his respiratory status.  Of care in the meantime.  Total time 25 minutes, over half spent in discussion with care team and patient's wife.  Nelida Meuse III Office: [phone]

## 2018-09-09 NOTE — Progress Notes (Addendum)
NAME:  Andres Cruz, MRN:  [id number], DOB:  [birth year], LOS: 2 ADMISSION DATE:  08/10/2018, CONSULTATION DATE:  09/07/2018 REFERRING MD:  ED CHIEF COMPLAINT:  Nausea, vomiting   Brief History   85 year old who presented to the ED with nausea and vomiting and found to have gastric outlet obstruction.  Chest CT was also concerning for left upper lobe and right lower lobe consolidation.  Patient was intubated in the ED due to difficulty inserting NG for decompression and put on propofol. OG tube was place and put on suction.  Surgery was consulted    Past Medical History  -Hypertension -Hyperlipidemia -Pancreatic insufficiency -BPH  Significant Hospital Events   1/31  Admit with gastric outlet obstruction, intubated   Consults:  -Surgery consult -PCCM  Procedures:  ETT 1/31 >>   Significant Diagnostic Tests:  CT chest, abdomen and Pelvis 2/1 >> massive distention of the stomach and proximal duodenum. Findings may represent a stricture or obstructive mass of the duodenum. The distended stomach elevates the left hemidiaphragm and displaces rightward the mediastinum EGD 2/2 >> Severe esophagitis with no bleeding was found in the lower third of the esophagus. Consistent with reflux and Candidiasis.  Large duodenal mass.    Micro Data:  Blood 1/31 >>  Sputum 1/31 >>   Antimicrobials:  Rocephin 2/1 >>  Azithro 2/1 >>  Fluconazole 2/2 >>   Subjective:  Pt remains on vent, sedated.  CCS note shows no plan for surgical intervention at this time.  Pathology pending from EGD   Objective   Blood pressure (!) 108/57, pulse 68, temperature 99.5 F (37.5 C), resp. rate 16, height 6' (1.829 m), weight 78.7 kg, SpO2 100 %.    Vent Mode: PRVC FiO2 (%):  [30 %] 30 % Set Rate:  [16 bmp] 16 bmp Vt Set:  [620 mL] 620 mL PEEP:  [5 cmH20] 5 cmH20 Plateau Pressure:  [8 cmH20-20 cmH20] 20 cmH20   Intake/Output Summary (Last 24 hours) at 09/09/2018 1036 Last data filed at 09/09/2018 7829 Gross  per 24 hour  Intake 768.74 ml  Output 1030 ml  Net -261.26 ml   Filed Weights   09/07/18 1215 09/09/2018 0130 09/09/18 0231  Weight: 72.7 kg 74 kg 78.7 kg    Examination: General: elderly male lying in bed on vent in NAD HEENT: MM pink/moist, ETT Neuro: Sedate CV: s1s2 rrr, distant heart tones, no m/r/g PULM: even/non-labored, lungs bilaterally clear anterior, fine bibasilar crackles  FA:OZHY, non-tender, bsx4 active  Extremities: warm/dry, trace BLE edema  Skin: no rashes or lesions  Assessment & Plan:   Gastric outlet obstruction -concern for malignant appearance of mass on EGD, no plan for surgical intervention Esophageal Candidiasis  P: Appreciate GI, CCS  GI discussing possible duodenal stent but unsure if anatomy would allow No plan for surgical intervention at this time  Diflucan for esophageal candidiasis   Acute Respiratory Failure on mechanical ventilation Pneumonia -CAP vs Aspiration  P: Continue PRVC 8cc/kg  Wean PEEP / FiO2 for sats >90% No plan for SBT / WUA given need to address gastric obstruction  Continue abx as ordered   Hypokalemia P: Monitor, replace as indicated, KCL given 2/3  At Risk AKI  P: Trend BMP / urinary output Avoid nephrotoxic agents, ensure adequate renal perfusion  Multiple fractures -hx fall in 07/2018, subacute fx of left superior and inferior pubic rami as well as right-greater-than-left sacrum. T8, T9, T11, T12, L1 compression deformities P: PT / OT when able  PRN pain  control with conservative management   Hyperlipidemia P: Hold home statin   HTN P: Hold home antihypertensive regimen   History of pancreatic insufficiency P: Pancrealipase on hold    Best practice:  Diet: NPO Pain/Anxiety/Delirium protocol (if indicated): PRN fentanyl VAP protocol (if indicated): HoB elevation, oral care DVT prophylaxis: SCDs, hold pharmacological prophylaxis for now incase he goes to surgery GI prophylaxis: PPI BID  Glucose  control: n/a Mobility: PT/OT once appropriate Code Status: Full code  Family Communication: No family at bedside am 2/3.  Will update on arrival. Note DNR.   Disposition: ICU status  Labs   CBC: Recent Labs  Lab 09/01/2018 2235 09/07/18 0500 09/07/2018 0350 09/09/18 0354  WBC 15.8* 12.1* 11.6* 11.3*  NEUTROABS 12.9*  --   --   --   HGB 13.3 13.0 11.8* 11.5*  HCT 42.0 39.9 36.3* 36.4*  MCV 93.1 94.1 91.0 92.9  PLT 272 216 166 132    Basic Metabolic Panel: Recent Labs  Lab 08/22/2018 2235 09/07/18 0500 09/15/2018 0350 09/09/18 0354  NA 138 137 138 140  K 3.7 2.9* 3.2* 2.8*  CL 96* 96* 99 104  CO2 27 26 26 24   GLUCOSE 187* 166* 110* 101*  BUN 24* 26* 23 23  CREATININE 1.16 1.13 1.03 0.92  CALCIUM 9.5 8.9 8.0* 7.8*  MG  --  1.8 1.8 2.1  PHOS  --  3.5  --   --    GFR: Estimated Creatinine Clearance: 62.1 mL/min (by C-G formula based on SCr of 0.92 mg/dL). Recent Labs  Lab 09/05/2018 2235 09/07/18 0233 09/07/18 0500 09/14/2018 0350 09/09/18 0354  WBC 15.8*  --  12.1* 11.6* 11.3*  LATICACIDVEN 2.9* 3.2*  --   --   --     Liver Function Tests: Recent Labs  Lab 09/02/2018 2235  AST 29  ALT 12  ALKPHOS 186*  BILITOT 1.5*  PROT 7.8  ALBUMIN 4.4   No results for input(s): LIPASE, AMYLASE in the last 168 hours. No results for input(s): AMMONIA in the last 168 hours.  ABG    Component Value Date/Time   PHART 7.401 09/07/2018 0230   PCO2ART 45.4 09/07/2018 0230   PO2ART 141 (H) 09/07/2018 0230   HCO3 27.6 09/07/2018 0230   O2SAT 98.7 09/07/2018 0230     Coagulation Profile: Recent Labs  Lab 09/03/2018 2235  INR 0.96    Cardiac Enzymes: No results for input(s): CKTOTAL, CKMB, CKMBINDEX, TROPONINI in the last 168 hours.  HbA1C: No results found for: HGBA1C  CBG: Recent Labs  Lab 09/07/18 1148 09/07/18 1607 09/07/18 2207  GLUCAP 129* 118* 111*   CC Time: 30 minutes   Andres Gens, NP-C [facility] Pgr: [phone] or if no answer  [phone] 09/09/2018, 10:43 AM  Attending Note:  85 year old male with extensive PMH who presents to PCCM with N/V due to gastric outlet obstruction from a gastric mass.  No events overnight.  On exam, decreased BS on the left and coarse otherwise.  I reviewed AXR myself, distention noted.  Will order a chest X-ray.  Discussed with PCCM-NP.  Will continue full vent support.  Awaiting pathology.  Wife does not wish for surgery.  Once pathology is available will likely go down the palliative route.  PCCM will continue to follow.  The patient is critically ill with multiple organ systems failure and requires high complexity decision making for assessment and support, frequent evaluation and titration of therapies, application of advanced monitoring technologies  and extensive interpretation of multiple databases.   Critical Care Time devoted to patient care services described in this note is  35  Minutes. This time reflects time of care of this signee Dr Jennet Maduro. This critical care time does not reflect procedure time, or teaching time or supervisory time of PA/NP/Med student/Med Resident etc but could involve care discussion time.  Rush Farmer, M.D. [facility]Critical Care Medicine. Pager: [phone]. After hours pager: [phone].

## 2018-09-09 NOTE — Progress Notes (Signed)
[facility] Progress Note Patient Name: Andres Cruz DOB: 05-26-1929 MRN: [id number]   Date of Service  09/09/2018  HPI/Events of Note  K+ = 2.8 and Creatinine = 0.92.   eICU Interventions  Will replace K+.      Intervention Category Major Interventions: Electrolyte abnormality - evaluation and management  Sommer,Steven Eugene 09/09/2018, 5:13 AM

## 2018-09-09 NOTE — Care Management Note (Signed)
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 No evidence of ileus or bowel obstruction POD 1 EGD MALIGNANT MASS FOUND NO SURG PLANNED POSS STENT FOR PALLIATIVE CARE ? Vomiting absent N/A ? Ambulatory[K] NO ? Oral hydration, medications, and diet   ? ON FULL VENT SUPPORT, SEDATED ? LEVEL OF CARE ICU   Action/Plan: Following for progression of care. Following for cm needs none present at this time.  Expected Discharge Date:                  Expected Discharge Plan:     In-House Referral:     Discharge planning Services     Post Acute Care Choice:    Choice offered to:     DME Arranged:    DME Agency:     HH Arranged:    HH Agency:     Status of Service:     If discussed at [employer], dates discussed:    Additional Comments:  Sarp, Vernier, RN 09/09/2018, 10:17 AM

## 2018-09-09 NOTE — Progress Notes (Signed)
CRITICAL VALUE ALERT  Critical Value:  Potassium 2.8  Date & Time Notied:09/09/2018 0500am  Provider Notified: Elink called   Orders Received/Actions taken: MD gave orders; RN will administer and continue to monitor

## 2018-09-09 NOTE — Progress Notes (Signed)
[facility] Progress Note  1 Day Post-Op  Subjective: CC-  Patient sedated on the vent.   Objective: Vital signs in last 24 hours: Temp:  [99.5 F (37.5 C)-101.1 F (38.4 C)] 99.7 F (37.6 C) (02/03 0600) Pulse Rate:  [65-107] 67 (02/03 0600) Resp:  [16-28] 16 (02/03 0600) BP: (83-145)/(38-100) 101/52 (02/03 0600) SpO2:  [100 %] 100 % (02/03 0600) FiO2 (%):  [30 %] 30 % (02/03 0435) Weight:  [78.7 kg] 78.7 kg (02/03 0231) Last BM Date: (PTA)  Intake/Output from previous day: 02/02 0701 - 02/03 0700 In: 395.7 [I.V.:295.6; IV Piggyback:100] Out: 1130 [Urine:730; Emesis/NG output:400] Intake/Output this shift: No intake/output data recorded.  PE: Gen:  Sedated, NAD HEENT: eyes closed Card:  RRR Pulm:  Decreased breath sounds bilaterally, few rhonchi, no wheezing Abd: Soft, NT/ND, +BS, no HSM Ext:  Calves soft and nontender Skin: warm and dry  Lab Results:  Recent Labs    09/18/2018 0350 09/09/18 0354  WBC 11.6* 11.3*  HGB 11.8* 11.5*  HCT 36.3* 36.4*  PLT 166 187   BMET Recent Labs    09/29/2018 0350 09/09/18 0354  NA 138 140  K 3.2* 2.8*  CL 99 104  CO2 26 24  GLUCOSE 110* 101*  BUN 23 23  CREATININE 1.03 0.92  CALCIUM 8.0* 7.8*   PT/INR Recent Labs    09/05/2018 2235  LABPROT 12.6  INR 0.96   CMP     Component Value Date/Time   NA 140 09/09/2018 0354   K 2.8 (L) 09/09/2018 0354   CL 104 09/09/2018 0354   CO2 24 09/09/2018 0354   GLUCOSE 101 (H) 09/09/2018 0354   BUN 23 09/09/2018 0354   CREATININE 0.92 09/09/2018 0354   CALCIUM 7.8 (L) 09/09/2018 0354   PROT 7.8 08/20/2018 2235   ALBUMIN 4.4 08/24/2018 2235   AST 29 08/24/2018 2235   ALT 12 09/04/2018 2235   ALKPHOS 186 (H) 08/30/2018 2235   BILITOT 1.5 (H) 08/30/2018 2235   GFRNONAA >60 09/09/2018 0354   GFRAA >60 09/09/2018 0354   Lipase  No results found for: LIPASE     Studies/Results: Dg Abd 1 View  Result Date: 09/26/2018 CLINICAL DATA:  Status post  orogastric tube placement. EXAM: ABDOMEN - 1 VIEW COMPARISON:  Radiograph of September 07, 2018. FINDINGS: No large or small bowel dilatation is noted. Continued elevated left hemidiaphragm is noted. Distal tip of nasogastric tube is seen in proximal stomach. No radio-opaque calculi or other significant radiographic abnormality are seen. IMPRESSION: Distal tip of nasogastric tube seen in expected position of proximal stomach. Electronically Signed   By: Marijo Conception, M.D.   On: 09/13/2018 10:49    Anti-infectives: Anti-infectives (From admission, onward)   Start     Dose/Rate Route Frequency Ordered Stop   09/09/18 1100  fluconazole (DIFLUCAN) IVPB 100 mg     100 mg 50 mL/hr over 60 Minutes Intravenous Every 24 hours 10/02/2018 1027 09/18/18 1059   09/11/2018 1100  fluconazole (DIFLUCAN) IVPB 200 mg     200 mg 100 mL/hr over 60 Minutes Intravenous  Once 09/15/2018 1027 10/02/2018 1245   09/07/18 0345  cefTRIAXone (ROCEPHIN) 1 g in sodium chloride 0.9 % 100 mL IVPB     1 g 200 mL/hr over 30 Minutes Intravenous Daily at bedtime 09/07/18 0333     09/07/18 0345  azithromycin (ZITHROMAX) 500 mg in sodium chloride 0.9 % 250 mL IVPB     500 mg 250 mL/hr over 60  Minutes Intravenous Daily at bedtime 09/07/18 0333 09/20/2018 2213       Assessment/Plan HTN HLD H/o pancreatic insufficiency BPH Pneumonia - abx per primary  GOO/D2 mass - s/p EGD 2/2 which showed 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 Biopsy pending. Depending on biopsy results and family's decision a duodenal stent could be considered for palliative treatment only. Patient's wife tells me that even if there is a surgery that could help he may not want this. Recommend palliative consult for GOC.   ID - rocephin 2/1>>, diflucan 2/2>>, azithromax 2/1>>2/2 FEN - IVF, NPO VTE - SCDs Foley - in place Follow up - TBD   LOS: 2 days    Wellington Hampshire , [facility] 09/09/2018, 8:17 AM Pager:  [phone] Mon-Thurs 7:00 am-4:30 pm Fri 7:00 am -11:30 AM Sat-Sun 7:00 am-11:30 am

## 2018-09-10 ENCOUNTER — Inpatient Hospital Stay (HOSPITAL_COMMUNITY): Payer: Medicare (Managed Care)

## 2018-09-10 DIAGNOSIS — E440 Moderate protein-calorie malnutrition: Secondary | ICD-10-CM

## 2018-09-10 DIAGNOSIS — E876 Hypokalemia: Secondary | ICD-10-CM

## 2018-09-10 DIAGNOSIS — K3189 Other diseases of stomach and duodenum: Secondary | ICD-10-CM

## 2018-09-10 LAB — BLOOD GAS, ARTERIAL
Acid-Base Excess: 2.6 mmol/L — ABNORMAL HIGH (ref 0.0–2.0)
Bicarbonate: 23.5 mmol/L (ref 20.0–28.0)
Drawn by: 232811
FIO2: 30
MECHVT: 620 mL
O2 SAT: 95.9 %
PEEP: 5 cmH2O
PH ART: 7.569 — AB (ref 7.350–7.450)
PO2 ART: 77.2 mmHg — AB (ref 83.0–108.0)
Patient temperature: 37.3
RATE: 16 resp/min
pCO2 arterial: 25.8 mmHg — ABNORMAL LOW (ref 32.0–48.0)

## 2018-09-10 LAB — BASIC METABOLIC PANEL
Anion gap: 14 (ref 5–15)
BUN: 22 mg/dL (ref 8–23)
CO2: 22 mmol/L (ref 22–32)
Calcium: 8 mg/dL — ABNORMAL LOW (ref 8.9–10.3)
Chloride: 103 mmol/L (ref 98–111)
Creatinine, Ser: 0.86 mg/dL (ref 0.61–1.24)
GFR calc Af Amer: >60 mL/min (ref >60)
GFR calc non Af Amer: >60 mL/min (ref >60)
Glucose, Bld: 79 mg/dL (ref 70–99)
Potassium: 3.4 mmol/L — ABNORMAL LOW (ref 3.5–5.1)
Sodium: 139 mmol/L (ref 135–145)

## 2018-09-10 LAB — CBC
HCT: 37.9 % — ABNORMAL LOW (ref 39.0–52.0)
Hemoglobin: 12.2 g/dL — ABNORMAL LOW (ref 13.0–17.0)
MCH: 29.4 pg (ref 26.0–34.0)
MCHC: 32.2 g/dL (ref 30.0–36.0)
MCV: 91.3 fL (ref 80.0–100.0)
Platelets: 162 10*3/uL (ref 150–400)
RBC: 4.15 MIL/uL — ABNORMAL LOW (ref 4.22–5.81)
RDW: 14.6 % (ref 11.5–15.5)
WBC: 13.9 10*3/uL — ABNORMAL HIGH (ref 4.0–10.5)
nRBC: 0 % (ref 0.0–0.2)

## 2018-09-10 LAB — MAGNESIUM: Magnesium: 2.4 mg/dL (ref 1.7–2.4)

## 2018-09-10 LAB — PHOSPHORUS: Phosphorus: 2.2 mg/dL — ABNORMAL LOW (ref 2.5–4.6)

## 2018-09-10 LAB — GLUCOSE, CAPILLARY: Glucose-Capillary: 76 mg/dL (ref 70–99)

## 2018-09-10 LAB — TRIGLYCERIDES: Triglycerides: 160 mg/dL — ABNORMAL HIGH (ref <150)

## 2018-09-10 MED ORDER — POTASSIUM CHLORIDE 10 MEQ/100ML IV SOLN
10.0000 meq | INTRAVENOUS | Status: AC
  Administered 2018-09-10 (×2): 10 meq via INTRAVENOUS
  Filled 2018-09-10 (×2): qty 100

## 2018-09-10 NOTE — Progress Notes (Addendum)
[facility] Progress Note Patient Name: Andres Cruz DOB: 13-Jul-1929 MRN: [id number]   Date of Service  09/10/2018  HPI/Events of Note  Nursing difficulty placing NG tube despite multiple attempts, Pt had more than one small bowel movements earlier today per nursing.  eICU Interventions  Hold further attempts at placement pending IR directed placement in AM        Marilin Kofman U Evany Schecter 09/10/2018, 10:07 PM

## 2018-09-10 NOTE — Progress Notes (Signed)
Nutrition Follow-up  DOCUMENTATION CODES:   Non-severe (moderate) malnutrition in context of chronic illness  INTERVENTION:  - If patient to remain intubated and aggressive care desired, will need to consider initiation of nutrition support.    NUTRITION DIAGNOSIS:   Moderate Malnutrition related to chronic illness as evidenced by mild fat depletion, mild muscle depletion, moderate muscle depletion. -revised  GOAL:   Patient will meet greater than or equal to 90% of their needs -unmet  MONITOR:   Vent status, Weight trends, Labs, Skin, I & O's  ASSESSMENT:   85 year old male admitted with several days of worsening abdominal pain, nausea and vomiting in the setting of weight loss found to have a gastric outlet obstruction due to duodenal obstruction.   Weight -5 kg/11 lb from admission. Patient remains intubated, sedated, with OGT to LIS with no output in canister; output in tubing that is a yellow-gray color. No family/visitors at bedside. Briefly talked with RN about patient.  Per Dr. Pura Spice note this AM: GOO with concern for malignancy--no plan for surgery, esophageal candidiasis, CAP vs aspiration PNA on vent with no plan for extubation at this time, at risk for AKI, plan for discussion with wife for POC/GOC.    Patient is currently intubated on ventilator support MV: 7.8 L/min Temp (24hrs), Avg:99.4 F (37.4 C), Min:97.9 F (36.6 C), Max:100.2 F (37.9 C) Propofol: 14.6 ml/hr (385 kcal) BP: 121/63 and MAP: 83  Medications reviewed; 40 mg IV protonix BID, 10 mEq IV KCl x6 runs 2/3 and x2 runs 2/4. Labs reviewed; K: 3.4 mmol/l, Ca: 8 mg/dl, Phos: 2.2 mg/dl. IVF; NS @ 20 ml/hr. Drip; propofol @ 30 mcg/kg/min.     Diet Order:   Diet Order            Diet NPO time specified  Diet effective now              EDUCATION NEEDS:   Not appropriate for education at this time  Skin:  Skin Assessment: Skin Integrity Issues: Skin Integrity Issues::  Unstageable Unstageable: full thickness to sacrum  Last BM:  PTA/unknown  Height:   Ht Readings from Last 1 Encounters:  09/09/18 6' (1.829 m)    Weight:   Wt Readings from Last 1 Encounters:  09/10/18 76.2 kg    Ideal Body Weight:  80.9 kg  BMI:  Body mass index is 22.78 kg/m.  Estimated Nutritional Needs:   Kcal:  1782 kcal  Protein:  110-125 grams  Fluid:  1.8L/day     Jarome Matin, MS, RD, LDN, CNSC Inpatient Clinical Dietitian Pager # [phone] After hours/weekend pager # [phone]

## 2018-09-10 NOTE — Progress Notes (Signed)
OG reinsertion attempts unsuccessful x2 at shift change. [place] notified, told it was okay to wait until the morning to re-attempt. Will continue to monitor

## 2018-09-10 NOTE — Progress Notes (Signed)
[facility] ADULT ICU REPLACEMENT PROTOCOL FOR AM LAB REPLACEMENT ONLY  The patient does apply for the [facility] Adult ICU Electrolyte Replacment Protocol based on the criteria listed below:   1. Is GFR >/= 40 ml/min? Yes.    Patient's GFR today is >60 2. Is urine output >/= 0.5 ml/kg/hr for the last 6 hours? Yes.   Patient's UOP is 0.5 ml/kg/hr 3. Is BUN < 60 mg/dL? Yes.    Patient's BUN today is 22 4. Abnormal electrolyte  K 3.4 5. Ordered repletion with: per protocol 6. If a panic level lab has been reported, has the CCM MD in charge been notified? Yes.  .   Physician:  Illene Labrador, Canary Brim 09/10/2018 5:17 AM

## 2018-09-10 NOTE — Progress Notes (Signed)
NAME:  Andres Cruz, MRN:  [id number], DOB:  10-Nov-1928, LOS: 3 ADMISSION DATE:  08/23/2018, CONSULTATION DATE:  09/07/2018 REFERRING MD:  ED CHIEF COMPLAINT:  Nausea, vomiting   Brief History   85 year old who presented to the ED with nausea and vomiting and found to have gastric outlet obstruction.  Chest CT was also concerning for left upper lobe and right lower lobe consolidation.  Patient was intubated in the ED due to difficulty inserting NG for decompression and put on propofol. OG tube was place and put on suction.  Surgery was consulted    Past Medical History  -Hypertension -Hyperlipidemia -Pancreatic insufficiency -BPH  Significant Hospital Events   1/31  Admit with gastric outlet obstruction, intubated   Consults:  -Surgery consult -PCCM  Procedures:  ETT 1/31 >>   Significant Diagnostic Tests:  CT chest, abdomen and Pelvis 2/1 >> massive distention of the stomach and proximal duodenum. Findings may represent a stricture or obstructive mass of the duodenum. The distended stomach elevates the left hemidiaphragm and displaces rightward the mediastinum EGD 2/2 >> Severe esophagitis with no bleeding was found in the lower third of the esophagus. Consistent with reflux and Candidiasis.  Large duodenal mass.    Micro Data:  Blood 1/31 >>  Sputum 1/31 >>   Antimicrobials:  Rocephin 2/1 >>  Azithro 2/1 >>  Fluconazole 2/2 >>   Subjective:  No events overnight, weaning this AM when placed on 5/5 Remains unresponsive and not tolerant of diet  Objective   Blood pressure (!) 148/79, pulse 84, temperature 98.4 F (36.9 C), resp. rate 20, height 6' (1.829 m), weight 76.2 kg, SpO2 100 %.    Vent Mode: PRVC FiO2 (%):  [30 %] 30 % Set Rate:  [16 bmp] 16 bmp Vt Set:  [620 mL] 620 mL PEEP:  [5 cmH20] 5 cmH20 Plateau Pressure:  [14 cmH20-20 cmH20] 19 cmH20   Intake/Output Summary (Last 24 hours) at 09/10/2018 0753 Last data filed at 09/10/2018 0401 Gross per 24 hour  Intake  962.93 ml  Output 775 ml  Net 187.93 ml   Filed Weights   09/14/2018 0130 09/09/18 0231 09/10/18 0402  Weight: 74 kg 78.7 kg 76.2 kg    Examination: General: Elderly male, NAD HEENT: [place]/AT, PERRL, EOM-I and MMM, ETT in place Neuro: Sedate but arousable, moving all ext to pain CV: RRR, Nl S1/S2 and -M/R/G PULM: Coarse BS diffusely GI: Soft, NT, ND and +BS Extremities: Trace edema Skin: no rashes or lesions  Assessment & Plan:   Gastric outlet obstruction -concern for malignant appearance of mass on EGD, no plan for surgical intervention Esophageal Candidiasis  P: Appreciate GI, CCS  GI stent is a possibility but patient will need to be extubated for an upper GI series to ID anatomy prior to decision on that. No plan for surgical intervention at this time  Diflucan for esophageal candidiasis   Acute Respiratory Failure on mechanical ventilation Pneumonia -CAP vs Aspiration  P: Begin PS trials, no extubation until able to ID mental status and coordinate with GI Wean PEEP / FiO2 for sats >90% Abx as ordered When on full support decrease RR to 12  Hypokalemia P: Replace IV and recheck in AM  At Risk AKI  P: Trend BMP / urinary output Avoid nephrotoxic agents, ensure adequate renal perfusion  Multiple fractures -hx fall in 07/2018, subacute fx of left superior and inferior pubic rami as well as right-greater-than-left sacrum. T8, T9, T11, T12, L1 compression deformities P: PT/OT  when able  PRN pain control with conservative management   Hyperlipidemia P: Continue to hold home statins  HTN P: Hold home antihypertensive regimen   History of pancreatic insufficiency P: Pancrealipase on hold   Awaiting arrival of wife to discuss plan of care, will recommend one way extubation then whatever GI is able to do then will consider  Best practice:  Diet: NPO Pain/Anxiety/Delirium protocol (if indicated): PRN fentanyl VAP protocol (if indicated): HoB elevation, oral  care DVT prophylaxis: SCDs, hold pharmacological prophylaxis for now incase he goes to surgery GI prophylaxis: PPI BID  Glucose control: n/a Mobility: PT/OT once appropriate Code Status: Full code  Family Communication: No family at bedside am 2/3.  Will update on arrival. Note DNR.   Disposition: ICU status  Labs   CBC: Recent Labs  Lab 09/02/2018 2235 09/07/18 0500 09/20/2018 0350 09/09/18 0354 09/10/18 0324  WBC 15.8* 12.1* 11.6* 11.3* 13.9*  NEUTROABS 12.9*  --   --   --   --   HGB 13.3 13.0 11.8* 11.5* 12.2*  HCT 42.0 39.9 36.3* 36.4* 37.9*  MCV 93.1 94.1 91.0 92.9 91.3  PLT 272 216 166 187 737    Basic Metabolic Panel: Recent Labs  Lab 08/15/2018 2235 09/07/18 0500 10/01/2018 0350 09/09/18 0354 09/10/18 0202  NA 138 137 138 140 139  K 3.7 2.9* 3.2* 2.8* 3.4*  CL 96* 96* 99 104 103  CO2 27 26 26 24 22   GLUCOSE 187* 166* 110* 101* 79  BUN 24* 26* 23 23 22   CREATININE 1.16 1.13 1.03 0.92 0.86  CALCIUM 9.5 8.9 8.0* 7.8* 8.0*  MG  --  1.8 1.8 2.1 2.4  PHOS  --  3.5  --   --  2.2*   GFR: Estimated Creatinine Clearance: 65.2 mL/min (by C-G formula based on SCr of 0.86 mg/dL). Recent Labs  Lab 08/23/2018 2235 09/07/18 0233 09/07/18 0500 09/11/2018 0350 09/09/18 0354 09/10/18 0324  WBC 15.8*  --  12.1* 11.6* 11.3* 13.9*  LATICACIDVEN 2.9* 3.2*  --   --   --   --     Liver Function Tests: Recent Labs  Lab 08/13/2018 2235  AST 29  ALT 12  ALKPHOS 186*  BILITOT 1.5*  PROT 7.8  ALBUMIN 4.4   No results for input(s): LIPASE, AMYLASE in the last 168 hours. No results for input(s): AMMONIA in the last 168 hours.  ABG    Component Value Date/Time   PHART 7.569 (H) 09/10/2018 0340   PCO2ART 25.8 (L) 09/10/2018 0340   PO2ART 77.2 (L) 09/10/2018 0340   HCO3 23.5 09/10/2018 0340   O2SAT 95.9 09/10/2018 0340     Coagulation Profile: Recent Labs  Lab 09/03/2018 2235  INR 0.96    Cardiac Enzymes: No results for input(s): CKTOTAL, CKMB, CKMBINDEX, TROPONINI in  the last 168 hours.  HbA1C: No results found for: HGBA1C  CBG: Recent Labs  Lab 09/07/18 1148 09/07/18 1607 09/07/18 2207  GLUCAP 129* 118* 111*   The patient is critically ill with multiple organ systems failure and requires high complexity decision making for assessment and support, frequent evaluation and titration of therapies, application of advanced monitoring technologies and extensive interpretation of multiple databases.   Critical Care Time devoted to patient care services described in this note is  33  Minutes. This time reflects time of care of this signee Dr Jennet Maduro. This critical care time does not reflect procedure time, or teaching time or supervisory time of PA/NP/Med student/Med Resident etc  but could involve care discussion time.  Rush Farmer, M.D. [facility] Pulmonary/Critical Care Medicine. Pager: [phone]. After hours pager: [phone].

## 2018-09-10 NOTE — Progress Notes (Addendum)
Patient ID: Andres Cruz, male   DOB: July 17, 1929, 85 y.o.   MRN: [id number]     Progress Note   Subjective  No family in room Remains on Vent, sedated  OG tube in place, minimal outpt   CXR today stable  Bibasilar atelectasis - OG coiled in hiatal hernia   Review of systems: Unable to obtain since he is intubated and sedated  Objective   Vital signs in last 24 hours: Temp:  [97.9 F (36.6 C)-100.2 F (37.9 C)] 98.8 F (37.1 C) (02/04 0900) Pulse Rate:  [63-117] 117 (02/04 0900) Resp:  [13-37] 37 (02/04 0900) BP: (91-160)/(45-114) 160/114 (02/04 0900) SpO2:  [100 %] 100 % (02/04 0900) FiO2 (%):  [30 %] 30 % (02/04 0758) Weight:  [76.2 kg] 76.2 kg (02/04 0402) Last BM Date: (PTA) General:    Elderly AA male  in NAD on vent Heart:  Regular rate and rhythm; no murmurs Lungs: Respirations even  And coarse  Abdomen:  Soft,  nondistended. BS present , no palp mass or HSM Extremities:  Without edema. Neurologic:  Sedated    Intake/Output from previous day: 02/03 0701 - 02/04 0700 In: 962.9 [I.V.:312.3; IV Piggyback:650.7] Out: 775 [Urine:425; Emesis/NG output:350] Intake/Output this shift: Total I/O In: 383.4 [I.V.:203.9; IV Piggyback:179.5] Out: -   Lab Results: Recent Labs    10/03/2018 0350 09/09/18 0354 09/10/18 0324  WBC 11.6* 11.3* 13.9*  HGB 11.8* 11.5* 12.2*  HCT 36.3* 36.4* 37.9*  PLT 166 187 162   BMET Recent Labs    09/22/2018 0350 09/09/18 0354 09/10/18 0202  NA 138 140 139  K 3.2* 2.8* 3.4*  CL 99 104 103  CO2 26 24 22   GLUCOSE 110* 101* 79  BUN 23 23 22   CREATININE 1.03 0.92 0.86  CALCIUM 8.0* 7.8* 8.0*   LFT No results for input(s): PROT, ALBUMIN, AST, ALT, ALKPHOS, BILITOT, BILIDIR, IBILI in the last 72 hours. PT/INR No results for input(s): LABPROT, INR in the last 72 hours.  Studies/Results: Dg Abd 1 View  Result Date: 10/01/2018 CLINICAL DATA:  Status post orogastric tube placement. EXAM: ABDOMEN - 1 VIEW COMPARISON:  Radiograph of  September 07, 2018. FINDINGS: No large or small bowel dilatation is noted. Continued elevated left hemidiaphragm is noted. Distal tip of nasogastric tube is seen in proximal stomach. No radio-opaque calculi or other significant radiographic abnormality are seen. IMPRESSION: Distal tip of nasogastric tube seen in expected position of proximal stomach. Electronically Signed   By: Marijo Conception, M.D.   On: 09/27/2018 10:49   Dg Chest Port 1 View  Result Date: 09/10/2018 CLINICAL DATA:  Community acquired pneumonia EXAM: PORTABLE CHEST 1 VIEW COMPARISON:  Three days ago FINDINGS: Endotracheal tube tip at the clavicular heads. The orogastric tube is coiled within the hiatal hernia. There is also elevated diaphragm with hazy density at the bases likely reflecting atelectasis based on recent chest CT. Normal heart size. No Kerley lines or pneumothorax. IMPRESSION: Stable hardware positioning and low lung volumes. Electronically Signed   By: Monte Fantasia M.D.   On: 09/10/2018 05:53       Assessment / Plan:     #1 85 yo male with high grade  gastric outlet obstruction secondary to malignant appearing duodenal mass.    OG in place for gastric decompression - films  today shows tube coiled in Hiatal hernia - needs repositioned Continue IV PPI BID  Possible duodenal stent to be considered  If pt  Recovers enough to come  off vent  #2 Esophageal candida- on diflucan IV #3 CAP vs aspiration pneumonia  With respiratory failure - requiring vent support -  Critical discussing weaning vent , and potential one way extubation- to be discussed further with family    Principal Problem:   Gastric outlet obstruction Active Problems:   Acute respiratory failure (HCC)   Multiple fractures   Hypokalemia   Duodenal mass   Pressure injury of skin     LOS: 3 days   Amy Esterwood  09/10/2018, 10:13 AM  I have discussed the case with the PA, and that is the plan I formulated. I personally interviewed and  examined the patient.  His condition is unchanged from yesterday.  He is still on vent support and quite weak.  I am not certain he will recover from this respiratory episode to allow any consideration of duodenal stent placement.  It sounds like critical care plans to speak with his family about a possible one-way extubation in the near future unless he makes significant respiratory improvement.  That seems a prudent plan given his age and overall poor condition and prognosis.    Nelida Meuse III Office: [phone]

## 2018-09-11 ENCOUNTER — Inpatient Hospital Stay (HOSPITAL_COMMUNITY): Payer: Medicare (Managed Care)

## 2018-09-11 DIAGNOSIS — I340 Nonrheumatic mitral (valve) insufficiency: Secondary | ICD-10-CM

## 2018-09-11 DIAGNOSIS — Z7189 Other specified counseling: Secondary | ICD-10-CM

## 2018-09-11 DIAGNOSIS — Z515 Encounter for palliative care: Secondary | ICD-10-CM

## 2018-09-11 LAB — CBC
HCT: 39.5 % (ref 39.0–52.0)
Hemoglobin: 12.4 g/dL — ABNORMAL LOW (ref 13.0–17.0)
MCH: 29.4 pg (ref 26.0–34.0)
MCHC: 31.4 g/dL (ref 30.0–36.0)
MCV: 93.6 fL (ref 80.0–100.0)
PLATELETS: 209 10*3/uL (ref 150–400)
RBC: 4.22 MIL/uL (ref 4.22–5.81)
RDW: 14.3 % (ref 11.5–15.5)
WBC: 13 10*3/uL — AB (ref 4.0–10.5)
nRBC: 0 % (ref 0.0–0.2)

## 2018-09-11 LAB — BLOOD GAS, ARTERIAL
Acid-Base Excess: 1.3 mmol/L (ref 0.0–2.0)
Bicarbonate: 23.5 mmol/L (ref 20.0–28.0)
Drawn by: 232811
FIO2: 30
MECHVT: 620 mL
O2 Saturation: 94.4 %
PEEP: 5 cmH2O
Patient temperature: 38
RATE: 12 resp/min
pCO2 arterial: 31.7 mmHg — ABNORMAL LOW (ref 32.0–48.0)
pH, Arterial: 7.488 — ABNORMAL HIGH (ref 7.350–7.450)
pO2, Arterial: 74.9 mmHg — ABNORMAL LOW (ref 83.0–108.0)

## 2018-09-11 LAB — BASIC METABOLIC PANEL
Anion gap: 13 (ref 5–15)
BUN: 19 mg/dL (ref 8–23)
CO2: 22 mmol/L (ref 22–32)
Calcium: 8.1 mg/dL — ABNORMAL LOW (ref 8.9–10.3)
Chloride: 105 mmol/L (ref 98–111)
Creatinine, Ser: 0.86 mg/dL (ref 0.61–1.24)
GFR calc non Af Amer: >60 mL/min (ref >60)
Glucose, Bld: 93 mg/dL (ref 70–99)
Potassium: 3.2 mmol/L — ABNORMAL LOW (ref 3.5–5.1)
SODIUM: 140 mmol/L (ref 135–145)

## 2018-09-11 LAB — PHOSPHORUS: Phosphorus: 2.6 mg/dL (ref 2.5–4.6)

## 2018-09-11 LAB — TROPONIN I
Troponin I: 0.16 ng/mL (ref <0.03)
Troponin I: 0.16 ng/mL (ref <0.03)

## 2018-09-11 LAB — GLUCOSE, CAPILLARY: Glucose-Capillary: 79 mg/dL (ref 70–99)

## 2018-09-11 LAB — MAGNESIUM: MAGNESIUM: 2.2 mg/dL (ref 1.7–2.4)

## 2018-09-11 MED ORDER — ACETAMINOPHEN 650 MG RE SUPP: 650.0000 mg | RECTAL | Status: DC | PRN

## 2018-09-11 MED ORDER — POTASSIUM CHLORIDE 20 MEQ/15ML (10%) PO SOLN: 30.0000 meq | ORAL | Status: DC

## 2018-09-11 MED ORDER — POTASSIUM CHLORIDE 10 MEQ/100ML IV SOLN
10.0000 meq | INTRAVENOUS | Status: AC
  Administered 2018-09-11 (×4): 10 meq via INTRAVENOUS
  Filled 2018-09-11 (×4): qty 100

## 2018-09-11 MED ORDER — MORPHINE 100MG IN NS 100ML (1MG/ML) PREMIX INFUSION
5.0000 mg/h | INTRAVENOUS | Status: DC
  Administered 2018-09-11 – 2018-09-12 (×2): 5 mg/h via INTRAVENOUS
  Filled 2018-09-11 (×2): qty 100

## 2018-09-11 MED ORDER — PERFLUTREN LIPID MICROSPHERE
1.0000 mL | INTRAVENOUS | Status: DC | PRN
  Administered 2018-09-11: 2 mL via INTRAVENOUS
  Filled 2018-09-11: qty 10

## 2018-09-11 NOTE — Progress Notes (Addendum)
eLink Physician-Brief Progress Note Patient Name: Andres Cruz DOB: 07-25-1929 MRN: [id number]   Date of Service  09/11/2018  HPI/Events of Note  Paroxsymal wide complex tachycardia, blood pressure stable, K+ 3.2, Mg ++ 2.2  eICU Interventions  KCL 10 meq iv hourly x 4 hours per protocol, check echo, cycle Troponins        Charidy Cappelletti U Genie Wenke 09/11/2018, 4:22 AM

## 2018-09-11 NOTE — Progress Notes (Signed)
eLink Physician-Brief Progress Note Patient Name: Andres Cruz DOB: [birth year] MRN: [id number]   Date of Service  09/11/2018  HPI/Events of Note  Fever  eICU Interventions  Tylenol suppository 650 mg per rectum Q 4 hours prn fever        Frederik Pear 09/11/2018, 2:55 AM

## 2018-09-11 NOTE — Progress Notes (Addendum)
Nutrition Follow-up  DOCUMENTATION CODES:   Non-severe (moderate) malnutrition in context of chronic illness  INTERVENTION:  - Will monitor for decisions following family meeting today.   -ADDENDUM: patient now comfort care after CCM discussion with family. RD will sign-off, please consult if needs arise.   NUTRITION DIAGNOSIS:   Moderate Malnutrition related to chronic illness as evidenced by mild fat depletion, mild muscle depletion, moderate muscle depletion. -ongoing  GOAL:   Patient will meet greater than or equal to 90% of their needs -unable to meet at this time  MONITOR:   Vent status, Weight trends, Labs, Skin, I & O's  ASSESSMENT:   85 year old male admitted with several days of worsening abdominal pain, nausea and vomiting in the setting of weight loss found to have a gastric outlet obstruction due to duodenal obstruction.   Weight slightly down from yesterday. Patient remains intubated and sedated. OGT was found to be looped in hiatal hernia per GI note 2/4; OGT subsequently removed. Attempt to replace OGT x2 yesterday was unsuccessful. Spoke with RN who reports no attempts this AM but plan for family meeting later this AM and then further decisions will be made.   Patient is currently intubated on ventilator support MV: 7.5 L/min Temp (24hrs), Avg:100.2 F (37.9 C), Min:99.1 F (37.3 C), Max:100.9 F (38.3 C) Propofol: 17.1 ml/hr (451 kcal) BP: 102/53 and MAP: 71  Medications reviewed; 40 mg IV protonix BID. Labs reviewed; K: 3.2 mmol/l, Ca: 8.1 mg/dl. IVF; NS @ 20 mmol/l. Drip; propofol @ 37.84 mcg/kg/min.     Diet Order:   Diet Order            Diet NPO time specified  Diet effective now              EDUCATION NEEDS:   Not appropriate for education at this time  Skin:  Skin Assessment: Skin Integrity Issues: Skin Integrity Issues:: Unstageable Unstageable: full thickness to sacrum  Last BM:  2/4  Height:   Ht Readings from Last 1  Encounters:  09/09/18 6' (1.829 m)    Weight:   Wt Readings from Last 1 Encounters:  09/11/18 75.1 kg    Ideal Body Weight:  80.9 kg  BMI:  Body mass index is 22.45 kg/m.  Estimated Nutritional Needs:   Kcal:  1828 kcal  Protein:  110-125 grams  Fluid:  1.8L/day     Jarome Matin, MS, RD, LDN, CNSC Inpatient Clinical Dietitian Pager # [phone] After hours/weekend pager # [phone]

## 2018-09-11 NOTE — Progress Notes (Signed)
CRITICAL VALUE ALERT  Critical Value:  Troponin 0.16  Date & Time Notied:  9:11 AM 09/11/2018  Provider Notified: Nelda Marseille  Orders Received/Actions taken: Trending troponin

## 2018-09-11 NOTE — Progress Notes (Addendum)
NAME:  Andres Cruz, MRN:  [id number], DOB:  1928/12/12, LOS: 4 ADMISSION DATE:  08/19/2018, CONSULTATION DATE:  09/07/2018 REFERRING MD:  ED CHIEF COMPLAINT:  Nausea, vomiting   Brief History   85 year old who presented to the ED with nausea and vomiting and found to have gastric outlet obstruction.  Chest CT was also concerning for left upper lobe and right lower lobe consolidation.  Patient was intubated in the ED due to difficulty inserting NG for decompression and put on propofol. OG tube was place and put on suction.  GI & Surgery consulted .     Past Medical History  -Hypertension -Hyperlipidemia -Pancreatic insufficiency -BPH  Significant Hospital Events   1/31  Admit with gastric outlet obstruction, intubated  2/02  EGD 2/05  Did not tolerated PSV (accessory muscle use)  Consults:  -Surgery consult -PCCM -GI  Procedures:  ETT 1/31 >>   Significant Diagnostic Tests:  CT chest, abdomen and Pelvis 2/1 >> massive distention of the stomach and proximal duodenum. Findings may represent a stricture or obstructive mass of the duodenum. The distended stomach elevates the left hemidiaphragm and displaces rightward the mediastinum EGD 2/2 >> Severe esophagitis with no bleeding was found in the lower third of the esophagus. Consistent with reflux and Candidiasis.  Large duodenal mass.    Micro Data:  Blood 1/31 >>  Sputum 1/31 >>   Antimicrobials:  Rocephin 2/1 >>  Azithro 2/1 >>  Fluconazole 2/2 >>   Subjective:  Tmax 99.1.  RT reports pt did not tolerate PSV wean this am due to tachypnea / accessory muscle use.   Objective   Blood pressure (!) 102/53, pulse 74, temperature 99.1 F (37.3 C), resp. rate 12, height 6' (1.829 m), weight 75.1 kg, SpO2 100 %.    Vent Mode: PRVC FiO2 (%):  [30 %] 30 % Set Rate:  [12 bmp] 12 bmp Vt Set:  [621 mL] 620 mL PEEP:  [5 cmH20] 5 cmH20 Plateau Pressure:  [16 cmH20-26 cmH20] 16 cmH20   Intake/Output Summary (Last 24 hours) at  09/11/2018 3086 Last data filed at 09/11/2018 0800 Gross per 24 hour  Intake 632.33 ml  Output 975 ml  Net -342.67 ml   Filed Weights   09/09/18 0231 09/10/18 0402 09/11/18 0215  Weight: 78.7 kg 76.2 kg 75.1 kg    Examination: General: frail, ill appearing elderly male lying in bed in NAD HEENT: MM pink/moist, ETT Neuro: sedate CV: s1s2 rrr, no m/r/g PULM: even/non-labored, lungs bilaterally diminished bases with bibasilar crackles  VH:QION, non-tender, bsx4 active  Extremities: warm/dry, trace generalized edema  Skin: no rashes or lesions  Assessment & Plan:   Gastric outlet obstruction -concern for malignant appearance of mass on EGD, no plan for surgical intervention Esophageal Candidiasis  P: Appreciate CCS, GI  GI would consider duodenal stent placement but he would have to be off mechanical ventilation for an upper GI series prior to decision  No plans for surgical intervention at this time  Continue diflucan for esophageal candidiasis   Acute Respiratory Failure on mechanical ventilation Pneumonia -CAP vs Aspiration  P: OK for PSV wean but does not meet criteria for extubation  Continue abx as above  PRVC as rest mode ventilation   Hypokalemia P: Follow, replace as indicated   At Risk AKI  P: Trend UOP, BMP   Multiple fractures -hx fall in 07/2018, subacute fx of left superior and inferior pubic rami as well as right-greater-than-left sacrum. T8, T9, T11, T12, L1 compression  deformities P: PT / OT when able  PRN pain medications   Hyperlipidemia P: Hold home statin  HTN P: Hold home antihypertensives   History of pancreatic insufficiency P: Pancrealipase on hold  Best practice:  Diet: NPO Pain/Anxiety/Delirium protocol (if indicated): PRN fentanyl VAP protocol (if indicated): ordered  DVT prophylaxis: SCDs, hold pharmacological prophylaxis for now in case he goes to surgery GI prophylaxis: PPI BID  Glucose control: n/a Mobility: PT/OT once  appropriate Code Status: Full code  Family Communication: Await wife's arrival 2/5 for discussion.  Daughter at bedside. Note DNR.   Disposition: ICU status  Labs   CBC: Recent Labs  Lab 08/23/2018 2235 09/07/18 0500 09/14/2018 0350 09/09/18 0354 09/10/18 0324 09/11/18 0243  WBC 15.8* 12.1* 11.6* 11.3* 13.9* 13.0*  NEUTROABS 12.9*  --   --   --   --   --   HGB 13.3 13.0 11.8* 11.5* 12.2* 12.4*  HCT 42.0 39.9 36.3* 36.4* 37.9* 39.5  MCV 93.1 94.1 91.0 92.9 91.3 93.6  PLT 272 216 166 187 162 258    Basic Metabolic Panel: Recent Labs  Lab 09/07/18 0500 09/11/2018 0350 09/09/18 0354 09/10/18 0202 09/11/18 0243  NA 137 138 140 139 140  K 2.9* 3.2* 2.8* 3.4* 3.2*  CL 96* 99 104 103 105  CO2 26 26 24 22 22   GLUCOSE 166* 110* 101* 79 93  BUN 26* 23 23 22 19   CREATININE 1.13 1.03 0.92 0.86 0.86  CALCIUM 8.9 8.0* 7.8* 8.0* 8.1*  MG 1.8 1.8 2.1 2.4 2.2  PHOS 3.5  --   --  2.2* 2.6   GFR: Estimated Creatinine Clearance: 64.3 mL/min (by C-G formula based on SCr of 0.86 mg/dL). Recent Labs  Lab 08/08/2018 2235 09/07/18 0233  09/21/2018 0350 09/09/18 0354 09/10/18 0324 09/11/18 0243  WBC 15.8*  --    < > 11.6* 11.3* 13.9* 13.0*  LATICACIDVEN 2.9* 3.2*  --   --   --   --   --    < > = values in this interval not displayed.    Liver Function Tests: Recent Labs  Lab 08/22/2018 2235  AST 29  ALT 12  ALKPHOS 186*  BILITOT 1.5*  PROT 7.8  ALBUMIN 4.4   No results for input(s): LIPASE, AMYLASE in the last 168 hours. No results for input(s): AMMONIA in the last 168 hours.  ABG    Component Value Date/Time   PHART 7.488 (H) 09/11/2018 0310   PCO2ART 31.7 (L) 09/11/2018 0310   PO2ART 74.9 (L) 09/11/2018 0310   HCO3 23.5 09/11/2018 0310   O2SAT 94.4 09/11/2018 0310     Coagulation Profile: Recent Labs  Lab 08/14/2018 2235  INR 0.96    Cardiac Enzymes: Recent Labs  Lab 09/11/18 0750  TROPONINI 0.16*    HbA1C: No results found for: HGBA1C  CBG: Recent Labs    Lab 09/07/18 1148 09/07/18 1607 09/07/18 2207 09/10/18 1000 09/11/18 0421  GLUCAP 129* 118* 111* 43 [place], NP-C [facility] Pgr: [phone] or if no answer [phone] 09/11/2018, 9:37 AM  Attending Note:  85 year old male with PMH above presenting with acute respiratory failure after an aspiration pneumonia for gastric outflow obstruction.  On exam, continue to fail weaning.  I reviewed CXR myself, ETT is in a good position.  Discussed with PCCM-NP.  Spoke with the family extensively and with GI.  There are little to no options left at this point.  Recommended  full comfort care and family is agreeable.  Will start morphine drip now and once family is ready will proceed with extubation.  PCCM will continue to follow.  The patient is critically ill with multiple organ systems failure and requires high complexity decision making for assessment and support, frequent evaluation and titration of therapies, application of advanced monitoring technologies and extensive interpretation of multiple databases.   Critical Care Time devoted to patient care services described in this note is  45  Minutes. This time reflects time of care of this signee Dr Jennet Maduro. This critical care time does not reflect procedure time, or teaching time or supervisory time of PA/NP/Med student/Med Resident etc but could involve care discussion time.  Rush Farmer, M.D. [facility] Pulmonary/Critical Care Medicine. Pager: [phone]. After hours pager: [phone].

## 2018-09-11 NOTE — Progress Notes (Signed)
   09/11/18 1200  Clinical Encounter Type  Visited With Patient and family together  Visit Type Initial;Psychological support;Spiritual support;Critical Care  Referral From Nurse  Consult/Referral To Chaplain  Spiritual Encounters  Spiritual Needs Emotional;Grief support;Other (Comment) (Spiritual Care Conversation/Support)  Stress Factors  Patient Stress Factors Not reviewed  Family Stress Factors Loss;Major life changes   I visited with the patient and his wife per spiritual care consult. Mrs. Mapel was at the bedside. She stated that her children are coming from [place], and that they are coming to terms with the Andres Cruz condition. Mrs. Seidman told me that she is at "peace" with knowing that her husband will be with God, and that he won't suffer anymore.  I provided grief support for her and let her know that she could call Spiritual Care anytime she needed.    Please, contact Spiritual Care for further assistance.   Chaplain Shanon Ace M.Div., [facility]

## 2018-09-11 NOTE — Progress Notes (Signed)
  Echocardiogram 2D Echocardiogram has been performed.  Andres Cruz 09/11/2018, 9:35 AM

## 2018-09-11 NOTE — Care Management Note (Signed)
Case Management Note  Patient Details  Name: Andres Cruz MRN: [id number] Date of Birth: 07-May-1929  Subjective/Objective:                  Resp. Failure on full vent support/temp 100.9/ hr=103/rr=35/  Discharge Readiness Return to top of Pneumonia RRG - ISC  Discharge readiness is indicated by patient meeting Recovery Milestones, including ALL of the following: ? Hemodynamic stability yes ? Tachypnea absent no rr=35 on vent ? Hypoxemia absent on vent ? Afebrile, or temperature acceptable for next level of care ? Temp =100.9 ? Oxygen absent or at baseline need no 30% fio2 on vent ? Mental status at baseline sedated ? Antibiotic regimen acceptable for next level of care  ? Iv rocephon, iv diflucan ? Ambulatory no ? Oral hydration, medications, and diet ? Npo, see above ? Level of Care=icu  Action/Plan: Following for progression of care. Following for case management needs.  Expected Discharge Date:                  Expected Discharge Plan:     In-House Referral:     Discharge planning Services     Post Acute Care Choice:    Choice offered to:     DME Arranged:    DME Agency:     HH Arranged:    HH Agency:     Status of Service:     If discussed at [employer], dates discussed:    Additional Comments:  Andres Cruz, [place], RN 09/11/2018, 9:36 AM

## 2018-09-12 LAB — CULTURE, BLOOD (ROUTINE X 2)
Culture: NO GROWTH
Culture: NO GROWTH
Special Requests: ADEQUATE
Special Requests: ADEQUATE

## 2018-09-12 MED ORDER — MORPHINE 100MG IN NS 100ML (1MG/ML) PREMIX INFUSION
0.0000 mg/h | INTRAVENOUS | Status: DC
  Filled 2018-09-12 (×2): qty 100

## 2018-09-12 MED ORDER — MORPHINE BOLUS VIA INFUSION
5.0000 mg | INTRAVENOUS | Status: DC | PRN
  Filled 2018-09-12: qty 5

## 2018-09-12 MED ORDER — GLYCOPYRROLATE 1 MG PO TABS: 1.0000 mg | ORAL_TABLET | ORAL | Status: DC | PRN

## 2018-09-12 MED ORDER — POLYVINYL ALCOHOL 1.4 % OP SOLN
1.0000 [drp] | Freq: Four times a day (QID) | OPHTHALMIC | Status: DC | PRN
  Filled 2018-09-12: qty 15

## 2018-09-12 MED ORDER — DEXTROSE 5 % IV SOLN
INTRAVENOUS | Status: DC
  Administered 2018-09-12: 10:00:00 via INTRAVENOUS

## 2018-09-12 MED ORDER — DIPHENHYDRAMINE HCL 50 MG/ML IJ SOLN: 25.0000 mg | INTRAMUSCULAR | Status: DC | PRN

## 2018-09-12 MED ORDER — GLYCOPYRROLATE 0.2 MG/ML IJ SOLN: 0.2000 mg | INTRAMUSCULAR | Status: DC | PRN

## 2018-09-12 MED ORDER — ACETAMINOPHEN 650 MG RE SUPP: 650.0000 mg | Freq: Four times a day (QID) | RECTAL | Status: DC | PRN

## 2018-09-12 MED ORDER — MORPHINE SULFATE (PF) 2 MG/ML IV SOLN: 2.0000 mg | INTRAVENOUS | Status: DC | PRN

## 2018-09-12 MED ORDER — ACETAMINOPHEN 325 MG PO TABS: 650.0000 mg | ORAL_TABLET | Freq: Four times a day (QID) | ORAL | Status: DC | PRN

## 2018-09-12 MED ORDER — MIDAZOLAM HCL 2 MG/2ML IJ SOLN
2.0000 mg | INTRAMUSCULAR | Status: DC | PRN
  Administered 2018-09-12: 4 mg via INTRAVENOUS
  Filled 2018-09-12: qty 4

## 2018-09-20 ENCOUNTER — Telehealth: Payer: Self-pay | Admitting: Pulmonary Disease

## 2018-09-20 NOTE — Telephone Encounter (Signed)
09/20/18 Received D/C back from Dr. Nelda Marseille. Will call Funeral home to pick up.[place] to pick up.  PWR 09/20/18

## 2018-10-06 NOTE — Procedures (Signed)
Extubation Procedure Note  Patient Details:   Name: Ammar Moffatt DOB: 07-10-1929 MRN: [id number]   Airway Documentation:  Airway 7.5 mm (Active)  Secured at (cm) 23 cm September 23, 2018  8:26 AM  Measured From Lips 09-23-18  8:26 AM  Secured Location Left September 23, 2018  8:26 AM  Secured By [employer] 09/23/2018  8:26 AM  Tube Holder Repositioned Yes 09/23/18  8:26 AM  Cuff Pressure (cm H2O) 24 cm H2O 09/11/2018  8:20 PM  Site Condition Dry Sep 23, 2018  8:26 AM   Vent end date: 09/23/18 Vent end time: 0957   Evaluation  O2 sats: currently acceptable Complications: No apparent complications Patient did tolerate procedure well. Bilateral Breath Sounds: Rhonchi, Diminished   No    Patient extubated for comfort care. Placed on 1 L [place] for comfort.   Lamonte Sakai 2018/09/23, 9:59 AM

## 2018-10-06 NOTE — Accreditation Note (Signed)
Restraints not reported to CMS Pursuant to regulation 482.13 (G) (3) use of soft wrist restraints was logged on 09/16/2018 @ 1436

## 2018-10-06 NOTE — Progress Notes (Signed)
Pt TOD 1944. Family was at bedside.  No heart sounds or breath sounds were auscultated. EKG rhythm showed asystole.  Hildred Alamin RN verified that there were no heart or breath sounds. [facility] was notified of the patient's death. Pt's wife gave necessary funeral home information. Wife collected all of the pt's and family's belongings.

## 2018-10-06 NOTE — Progress Notes (Signed)
Spoke with critical care yesterday.  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

## 2018-10-06 NOTE — Progress Notes (Signed)
Wasted 41mL of morphine with Arthor Captain RN. Wasted in [employer]

## 2018-10-06 NOTE — Progress Notes (Signed)
NAME:  Andres Cruz, MRN:  [id number], DOB:  1928/09/29, LOS: 5 ADMISSION DATE:  09/03/2018, CONSULTATION DATE:  09/07/2018 REFERRING MD:  ED CHIEF COMPLAINT:  Nausea, vomiting   Brief History   85 year old who presented to the ED with nausea and vomiting and found to have gastric outlet obstruction.  Chest CT was also concerning for left upper lobe and right lower lobe consolidation.  Patient was intubated in the ED due to difficulty inserting NG for decompression and put on propofol. OG tube was place and put on suction.  GI & Surgery consulted .     Past Medical History  -Hypertension -Hyperlipidemia -Pancreatic insufficiency -BPH  Significant Hospital Events   1/31  Admit with gastric outlet obstruction, intubated  2/02  EGD 2/05  Did not tolerated PSV (accessory muscle use)  Consults:  -Surgery consult -PCCM -GI  Procedures:  ETT 1/31 >>   Significant Diagnostic Tests:  CT chest, abdomen and Pelvis 2/1 >> massive distention of the stomach and proximal duodenum. Findings may represent a stricture or obstructive mass of the duodenum. The distended stomach elevates the left hemidiaphragm and displaces rightward the mediastinum EGD 2/2 >> Severe esophagitis with no bleeding was found in the lower third of the esophagus. Consistent with reflux and Candidiasis.  Large duodenal mass.    Micro Data:  Blood 1/31 >>  Sputum 1/31 >>   Antimicrobials:  Rocephin 2/1 >>  Azithro 2/1 >>  Fluconazole 2/2 >>   Subjective:  No events overnight, failed weaning again, appears comfortable now on morphine and propofol drip  Objective   Blood pressure (!) 96/49, pulse 70, temperature 100 F (37.8 C), temperature source Axillary, resp. rate 12, height 6' (1.829 m), weight 74.5 kg, SpO2 100 %.    Vent Mode: PRVC FiO2 (%):  [30 %] 30 % Set Rate:  [12 bmp] 12 bmp Vt Set:  [620 mL-9620 mL] 9620 mL PEEP:  [5 cmH20] 5 cmH20 Plateau Pressure:  [16 cmH20-20 cmH20] 19 cmH20   Intake/Output  Summary (Last 24 hours) at 2018/09/21 0855 Last data filed at 2018-09-21 0800 Gross per 24 hour  Intake 601.35 ml  Output 400 ml  Net 201.35 ml   Filed Weights   09/10/18 0402 09/11/18 0215 2018/09/21 0331  Weight: 76.2 kg 75.1 kg 74.5 kg    Examination: General: Frail elderly male, in exam bed, sedate and comfortable appearing HEENT: [place]/AT, PERRL, EOM-I and MMM Neuro: Sedate, not following commands CV: RRR, Nl S1/S2 and -M/R/G PULM: Decreased BS diffusely GI: Soft, NT, ND and +BS Extremities: generalized edema Skin: Intact  Assessment & Plan:   Spoke with family at length, had to discuss further withdrawal and DNR status.  They are now agreeable and awaiting one more family member.  Will maintain patient on morphine and propofol for now.  Once the other daughter arrives will extubate and increase morphine and propofol for comfort.  Will d/c all medications and blood draws.  Discussed with bedside RN, once family is ready the orders are to be released.  Labs   CBC: Recent Labs  Lab 08/11/2018 2235 09/07/18 0500 09/09/2018 0350 09/09/18 0354 09/10/18 0324 09/11/18 0243  WBC 15.8* 12.1* 11.6* 11.3* 13.9* 13.0*  NEUTROABS 12.9*  --   --   --   --   --   HGB 13.3 13.0 11.8* 11.5* 12.2* 12.4*  HCT 42.0 39.9 36.3* 36.4* 37.9* 39.5  MCV 93.1 94.1 91.0 92.9 91.3 93.6  PLT 272 216 166 187 162 209  Basic Metabolic Panel: Recent Labs  Lab 09/07/18 0500 09/29/2018 0350 09/09/18 0354 09/10/18 0202 09/11/18 0243  NA 137 138 140 139 140  K 2.9* 3.2* 2.8* 3.4* 3.2*  CL 96* 99 104 103 105  CO2 26 26 24 22 22   GLUCOSE 166* 110* 101* 79 93  BUN 26* 23 23 22 19   CREATININE 1.13 1.03 0.92 0.86 0.86  CALCIUM 8.9 8.0* 7.8* 8.0* 8.1*  MG 1.8 1.8 2.1 2.4 2.2  PHOS 3.5  --   --  2.2* 2.6   GFR: Estimated Creatinine Clearance: 63.8 mL/min (by C-G formula based on SCr of 0.86 mg/dL). Recent Labs  Lab 09/05/2018 2235 09/07/18 0233  10/01/2018 0350 09/09/18 0354 09/10/18 0324  09/11/18 0243  WBC 15.8*  --    < > 11.6* 11.3* 13.9* 13.0*  LATICACIDVEN 2.9* 3.2*  --   --   --   --   --    < > = values in this interval not displayed.    Liver Function Tests: Recent Labs  Lab 08/22/2018 2235  AST 29  ALT 12  ALKPHOS 186*  BILITOT 1.5*  PROT 7.8  ALBUMIN 4.4   No results for input(s): LIPASE, AMYLASE in the last 168 hours. No results for input(s): AMMONIA in the last 168 hours.  ABG    Component Value Date/Time   PHART 7.488 (H) 09/11/2018 0310   PCO2ART 31.7 (L) 09/11/2018 0310   PO2ART 74.9 (L) 09/11/2018 0310   HCO3 23.5 09/11/2018 0310   O2SAT 94.4 09/11/2018 0310     Coagulation Profile: Recent Labs  Lab 09/01/2018 2235  INR 0.96    Cardiac Enzymes: Recent Labs  Lab 09/11/18 0750 09/11/18 1402  TROPONINI 0.16* 0.16*    HbA1C: No results found for: HGBA1C  CBG: Recent Labs  Lab 09/07/18 1148 09/07/18 1607 09/07/18 2207 09/10/18 1000 09/11/18 0421  GLUCAP 129* 118* 111* 76 79   The patient is critically ill with multiple organ systems failure and requires high complexity decision making for assessment and support, frequent evaluation and titration of therapies, application of advanced monitoring technologies and extensive interpretation of multiple databases.   Critical Care Time devoted to patient care services described in this note is  32  Minutes. This time reflects time of care of this signee Dr Jennet Maduro. This critical care time does not reflect procedure time, or teaching time or supervisory time of PA/NP/Med student/Med Resident etc but could involve care discussion time.  Rush Farmer, M.D. [facility] Pulmonary/Critical Care Medicine. Pager: [phone]. After hours pager: [phone].

## 2018-10-06 NOTE — Death Summary Note (Signed)
DEATH SUMMARY   Patient Details  Name: Andres Cruz MRN: [id number] DOB: [birth year]  Admission/Discharge Information   Admit Date:  09-22-18  Date of Death: Date of Death: 09-28-18  Time of Death: Time of Death: 11-16-1934  Length of Stay: 5  Referring Physician: [facility], [place]   Reason(s) for Hospitalization  Gastric outlet obstruction  Diagnoses  Preliminary cause of death:   Septic shock  Secondary Diagnoses (including complications and co-morbidities):  Principal Problem:   Gastric outlet obstruction Active Problems:   Acute respiratory failure ([place])   Multiple fractures   Hypokalemia   Duodenal mass   Pressure injury of skin   Malnutrition of moderate degree   Brief Hospital Course (including significant findings, care, treatment, and services provided and events leading to death)  85 year old who presented to the ED with nausea and vomiting and found to have gastric outlet obstruction.  Chest CT was also concerning for left upper lobe and right lower lobe consolidation.  Patient was intubated in the ED due to difficulty inserting NG for decompression and put on propofol. OG tube was place and put on suction.  GI & Surgery consulted .     Patient continued to be show lack of progress and on 2/5 developed respiratory failure.  I had an extensive discussion with the daughters who after long discussion decided that patient would not want to have further aggressive care and proceed with comfort care at which point comfort measures were instituted and patient was extubated to expire shortly thereafter with the family bedside.    Pertinent Labs and Studies  Significant Diagnostic Studies Ct Abdomen Pelvis Wo Contrast  Result Date: 09/07/2018 CLINICAL DATA:  85 y/o M; chest and abdominal pain. Abdominal distention. Dyspnea. Nausea and vomiting. EXAM: CT CHEST, ABDOMEN AND PELVIS WITHOUT CONTRAST TECHNIQUE: Multidetector CT imaging of the chest,  abdomen and pelvis was performed following the standard protocol without IV contrast. COMPARISON:  None. FINDINGS: CT CHEST FINDINGS Cardiovascular: Normal heart size. No pericardial effusion. Coronary artery calcific atherosclerosis. Thoracic aorta measures up to 4.3 cm. Mediastinum/Nodes: Marked distention of the esophagus with fluid level likely due to downstream obstruction. Normal thyroid. No mediastinal or axillary adenopathy. Lungs/Pleura: Patchy areas of consolidation within the left upper lobe lingula and right lower lobe with clustered nodules compatible with bronchopneumonia. Findings may represent aspiration. No pleural effusion or pneumothorax. Musculoskeletal: T8, T9, T11, T12, L1 compression deformities, age indeterminate. T11 augmentation. L4-5 grade 1 anterolisthesis. CT ABDOMEN PELVIS FINDINGS Hepatobiliary: Pneumobilia within the left lobe of the liver. Otherwise no focal liver abnormality is seen. No gallstones, gallbladder wall thickening, or biliary dilatation. Pancreas: Unremarkable. No pancreatic ductal dilatation or surrounding inflammatory changes. Spleen: Normal in size without focal abnormality. Adrenals/Urinary Tract: Normal adrenal glands. Multiple renal cysts bilaterally measuring up to 6.3 cm arising from the left kidney upper pole. No urinary stone disease or hydronephrosis. Bladder is unremarkable. Stomach/Bowel: Massive distention of the stomach and proximal duodenum to the level of the junction between second and third segments of the duodenum (series 4, image 94). Massive distention of the stomach elevates the left hemidiaphragm. No obstructive or inflammatory changes of the small and large bowel downstream to the duodenum. Sigmoid diverticulosis. Normal appendix. Vascular/Lymphatic: Aortic atherosclerosis. No enlarged abdominal or pelvic lymph nodes. Reproductive: Prostate is unremarkable. Other: Small left inguinal hernia containing fat. Musculoskeletal: Right total hip  prosthesis without periprosthetic lucency or fracture identified, incompletely visualized. The acetabular screw traverses the medial cortex of the acetabulum  extending into the iliopsoas muscle (series 4, image 104). Subacute fractures with callus formation involving the left superior and inferior pubic rami. Right-greater-than-left sacral fractures. IMPRESSION: 1. Massive distention of the stomach and proximal duodenum. Findings may represent a stricture or obstructive mass of the duodenum. The distended stomach elevates the left hemidiaphragm and displaces rightward the mediastinum. 2. Distention of esophagus with fluid levels likely due to downstream duodenal obstruction. 3. Left upper lobe lingula and right lower lobe consolidation may reflect bronchopneumonia or possibly aspiration. 4. Thoracic aortic aneurysm measuring up to 4.3 cm. Recommend annual imaging followup by CTA or MRA. This recommendation follows 2010 ACCF/AHA/AATS/ACR/ASA/SCA/SCAI/SIR/STS/SVM Guidelines for the Diagnosis and Management of Patients with Thoracic Aortic Disease. 2010; 121: X528-U132. 5. Coronary artery and aortic calcific 6. Subacute fractures of left superior and inferior pubic rami as well as right-greater-than-left sacrum. 7. T8, T9, T11, T12, L1 compression deformities, age indeterminate. Electronically Signed   By: Kristine Garbe M.D.   On: 09/07/2018 00:39   Dg Chest 2 View  Result Date: 08/25/2018 CLINICAL DATA:  Shortness of breath EXAM: CHEST - 2 VIEW COMPARISON:  None. FINDINGS: Marked elevation of the left hemidiaphragm and gaseous distention of the stomach beneath the left hemidiaphragm. Compressive atelectasis in the left lower lobe. Low volumes on the right without confluent airspace opacity. Heart appears to be normal size. No visible effusions or pneumothorax. No acute bony abnormality. IMPRESSION: Marked gaseous distention of the stomach and elevation of the left hemidiaphragm. Compressive atelectasis  in the left lower lobe. Very low lung volumes. Electronically Signed   By: Rolm Baptise M.D.   On: 08/12/2018 23:11   Dg Abd 1 View  Result Date: 09/18/2018 CLINICAL DATA:  Status post orogastric tube placement. EXAM: ABDOMEN - 1 VIEW COMPARISON:  Radiograph of September 07, 2018. FINDINGS: No large or small bowel dilatation is noted. Continued elevated left hemidiaphragm is noted. Distal tip of nasogastric tube is seen in proximal stomach. No radio-opaque calculi or other significant radiographic abnormality are seen. IMPRESSION: Distal tip of nasogastric tube seen in expected position of proximal stomach. Electronically Signed   By: Marijo Conception, M.D.   On: 09/19/2018 10:49   Dg Abdomen 1 View  Result Date: 09/07/2018 CLINICAL DATA:  Orogastric tube placement EXAM: ABDOMEN - 1 VIEW COMPARISON:  CT 08/29/2018 FINDINGS: The tip and side port of a gastric tube are noted in the left upper quadrant in the expected location of the distended stomach. Stool is seen within nonobstructed, nondistended large bowel. Atelectasis is seen at the lung bases. Partially included right hip arthroplasty. IMPRESSION: Gastric tube with tip and side-port are seen in the expected location of the stomach. Electronically Signed   By: Ashley Royalty M.D.   On: 09/07/2018 02:20   Ct Chest Wo Contrast  Result Date: 09/07/2018 CLINICAL DATA:  85 y/o M; chest and abdominal pain. Abdominal distention. Dyspnea. Nausea and vomiting. EXAM: CT CHEST, ABDOMEN AND PELVIS WITHOUT CONTRAST TECHNIQUE: Multidetector CT imaging of the chest, abdomen and pelvis was performed following the standard protocol without IV contrast. COMPARISON:  None. FINDINGS: CT CHEST FINDINGS Cardiovascular: Normal heart size. No pericardial effusion. Coronary artery calcific atherosclerosis. Thoracic aorta measures up to 4.3 cm. Mediastinum/Nodes: Marked distention of the esophagus with fluid level likely due to downstream obstruction. Normal thyroid. No mediastinal or  axillary adenopathy. Lungs/Pleura: Patchy areas of consolidation within the left upper lobe lingula and right lower lobe with clustered nodules compatible with bronchopneumonia. Findings may represent aspiration. No pleural  effusion or pneumothorax. Musculoskeletal: T8, T9, T11, T12, L1 compression deformities, age indeterminate. T11 augmentation. L4-5 grade 1 anterolisthesis. CT ABDOMEN PELVIS FINDINGS Hepatobiliary: Pneumobilia within the left lobe of the liver. Otherwise no focal liver abnormality is seen. No gallstones, gallbladder wall thickening, or biliary dilatation. Pancreas: Unremarkable. No pancreatic ductal dilatation or surrounding inflammatory changes. Spleen: Normal in size without focal abnormality. Adrenals/Urinary Tract: Normal adrenal glands. Multiple renal cysts bilaterally measuring up to 6.3 cm arising from the left kidney upper pole. No urinary stone disease or hydronephrosis. Bladder is unremarkable. Stomach/Bowel: Massive distention of the stomach and proximal duodenum to the level of the junction between second and third segments of the duodenum (series 4, image 94). Massive distention of the stomach elevates the left hemidiaphragm. No obstructive or inflammatory changes of the small and large bowel downstream to the duodenum. Sigmoid diverticulosis. Normal appendix. Vascular/Lymphatic: Aortic atherosclerosis. No enlarged abdominal or pelvic lymph nodes. Reproductive: Prostate is unremarkable. Other: Small left inguinal hernia containing fat. Musculoskeletal: Right total hip prosthesis without periprosthetic lucency or fracture identified, incompletely visualized. The acetabular screw traverses the medial cortex of the acetabulum extending into the iliopsoas muscle (series 4, image 104). Subacute fractures with callus formation involving the left superior and inferior pubic rami. Right-greater-than-left sacral fractures. IMPRESSION: 1. Massive distention of the stomach and proximal  duodenum. Findings may represent a stricture or obstructive mass of the duodenum. The distended stomach elevates the left hemidiaphragm and displaces rightward the mediastinum. 2. Distention of esophagus with fluid levels likely due to downstream duodenal obstruction. 3. Left upper lobe lingula and right lower lobe consolidation may reflect bronchopneumonia or possibly aspiration. 4. Thoracic aortic aneurysm measuring up to 4.3 cm. Recommend annual imaging followup by CTA or MRA. This recommendation follows 2010 ACCF/AHA/AATS/ACR/ASA/SCA/SCAI/SIR/STS/SVM Guidelines for the Diagnosis and Management of Patients with Thoracic Aortic Disease. 2010; 121: [id number]. 5. Coronary artery and aortic calcific 6. Subacute fractures of left superior and inferior pubic rami as well as right-greater-than-left sacrum. 7. T8, T9, T11, T12, L1 compression deformities, age indeterminate. Electronically Signed   By: Kristine Garbe M.D.   On: 09/07/2018 00:39   Dg Chest Port 1 View  Result Date: 09/11/2018 CLINICAL DATA:  Hypoxia EXAM: PORTABLE CHEST 1 VIEW COMPARISON:  September 10, 2018 FINDINGS: Endotracheal tube tip is 4.7 cm above the carina. No pneumothorax. Nasogastric tube is no longer evident. There are pleural effusions bilaterally with bibasilar atelectasis. Heart is upper normal in size with pulmonary vascularity normal. No adenopathy. Soft GI hernia again noted.  No bone lesions. IMPRESSION: Nasogastric tube no longer appreciable. Endotracheal tube as described. No pneumothorax. Soft GI hernia noted. There are pleural effusions bilaterally with bibasilar atelectasis. Stable cardiac silhouette. Electronically Signed   By: Lowella Grip III M.D.   On: 09/11/2018 07:38   Dg Chest Port 1 View  Result Date: 09/10/2018 CLINICAL DATA:  Community acquired pneumonia EXAM: PORTABLE CHEST 1 VIEW COMPARISON:  Three days ago FINDINGS: Endotracheal tube tip at the clavicular heads. The orogastric tube is coiled within  the hiatal hernia. There is also elevated diaphragm with hazy density at the bases likely reflecting atelectasis based on recent chest CT. Normal heart size. No Kerley lines or pneumothorax. IMPRESSION: Stable hardware positioning and low lung volumes. Electronically Signed   By: Monte Fantasia M.D.   On: 09/10/2018 05:53   Dg Chest Port 1 View  Result Date: 09/07/2018 CLINICAL DATA:  Post intubation EXAM: PORTABLE CHEST 1 VIEW COMPARISON:  09/05/2018 FINDINGS: Endotracheal tube is been  placed with tip measuring 5.4 cm above the carina. Enteric tube is been placed with tip over the left lower chest consistent with location in the stomach which on the previous study was demonstrated below and elevated left hemidiaphragm. The stomach has decompressed since the previous study. There is a shallow inspiration. Atelectasis or infiltration in both lung bases with probable small pleural effusions. Heart size is normal. No pneumothorax. Degenerative changes in the shoulders. IMPRESSION: Appliances appear in satisfactory location. Shallow inspiration with atelectasis or infiltration in the lung bases and small bilateral pleural effusions. Electronically Signed   By: Lucienne Capers M.D.   On: 09/07/2018 02:16   Dg Abd Portable 1v  Result Date: 09/10/2018 CLINICAL DATA:  Orogastric tube placement. EXAM: PORTABLE ABDOMEN - 1 VIEW COMPARISON:  Abdomen and pelvis CT dated 08/07/2018 and abdomen radiographs dated 09/14/2018. FINDINGS: Again demonstrated significant elevation of the left hemidiaphragm less distention of the stomach. The orogastric tube is coiled in the stomach with its tip in the proximal to mid stomach medially. The remainder of the visualized bowel gas pattern is normal with mildly prominent stool. Lumbar spine degenerative changes and lumbar and thoracic spine compression deformities with previously demonstrated T11 kyphoplasty material. Right hip prosthesis. IMPRESSION: Orogastric tube coiled in the  stomach with its tip in the proximal to mid stomach and approved gastric distention. Electronically Signed   By: Claudie Revering M.D.   On: 09/10/2018 19:15    Microbiology No results found for this or any previous visit (from the past 240 hour(s)).  Lab Basic Metabolic Panel: No results for input(s): NA, K, CL, CO2, GLUCOSE, BUN, CREATININE, CALCIUM, MG, PHOS in the last 168 hours. Liver Function Tests: No results for input(s): AST, ALT, ALKPHOS, BILITOT, PROT, ALBUMIN in the last 168 hours. No results for input(s): LIPASE, AMYLASE in the last 168 hours. No results for input(s): AMMONIA in the last 168 hours. CBC: No results for input(s): WBC, NEUTROABS, HGB, HCT, MCV, PLT in the last 168 hours. Cardiac Enzymes: No results for input(s): CKTOTAL, CKMB, CKMBINDEX, TROPONINI in the last 168 hours. Sepsis Labs: No results for input(s): PROCALCITON, WBC, LATICACIDVEN in the last 168 hours.  Procedures/Operations     Archita Lomeli 09/18/2018, 5:54 PM

## 2018-10-06 DEATH — deceased

## 2019-10-05 IMAGING — DX DG ABDOMEN 1V
1 series · 1 of 1 positions shown · non-contrast
Comparison: CT 09/06/2018

CLINICAL DATA: Orogastric tube placement

EXAM:
ABDOMEN - 1 VIEW

[abdomen kub]
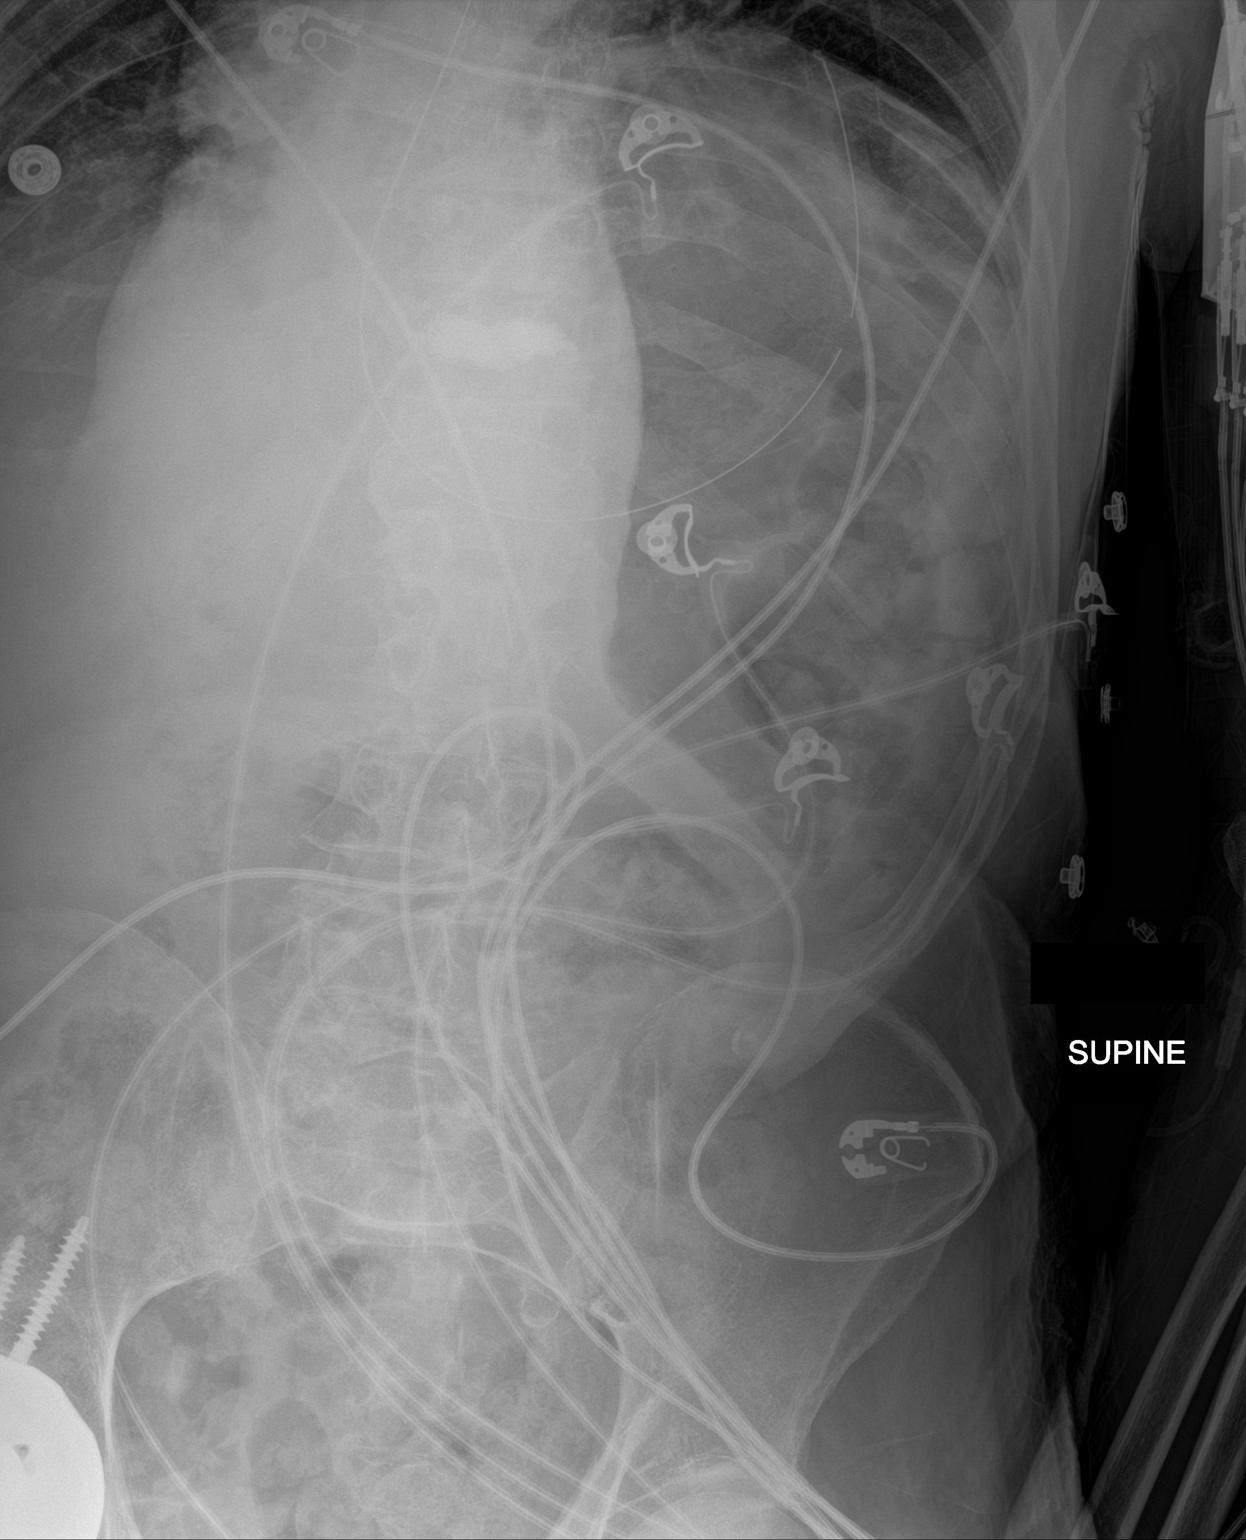

[1 of 1 positions shown; findings below may reference images not displayed]

FINDINGS: The tip and side port of a gastric tube are noted in the left upper
quadrant in the expected location of the distended stomach. Stool is
seen within nonobstructed, nondistended large bowel. Atelectasis is
seen at the lung bases. Partially included right hip arthroplasty.
IMPRESSION: Gastric tube with tip and side-port are seen in the expected
location of the stomach.

## 2019-10-09 IMAGING — DX DG CHEST 1V PORT
1 series · 1 of 1 positions shown · non-contrast
Comparison: September 10, 2018

CLINICAL DATA: Hypoxia

EXAM:
PORTABLE CHEST 1 VIEW

[chest ap]
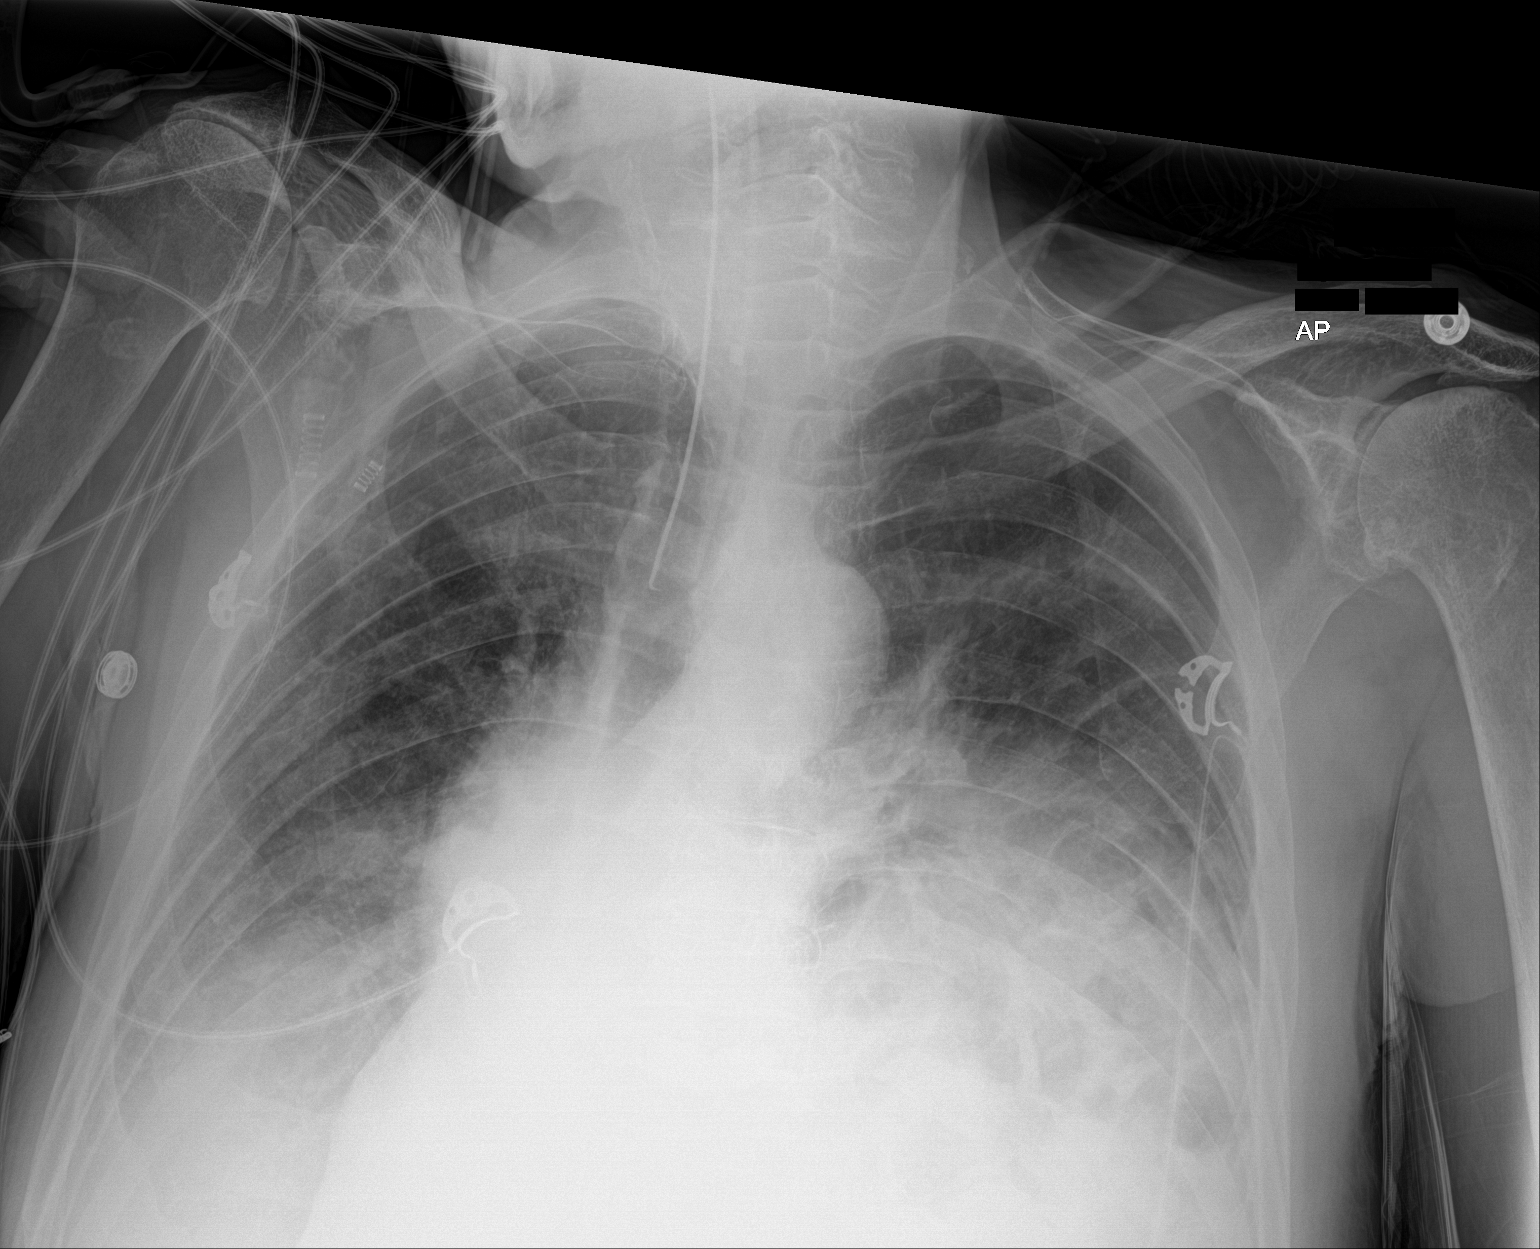

[1 of 1 positions shown; findings below may reference images not displayed]

FINDINGS: Endotracheal tube tip is 4.7 cm above the carina. No pneumothorax.
Nasogastric tube is no longer evident.

There are pleural effusions bilaterally with bibasilar atelectasis.
Heart is upper normal in size with pulmonary vascularity normal. No
adenopathy.

Soft GI hernia again noted.  No bone lesions.
IMPRESSION: Nasogastric tube no longer appreciable. Endotracheal tube as
described. No pneumothorax.

Soft GI hernia noted. There are pleural effusions bilaterally with
bibasilar atelectasis. Stable cardiac silhouette.
